# Patient Record
Sex: Male | Born: 1980 | Race: White | Hispanic: No | Marital: Single | Smoking: Former smoker
Health system: Southern US, Community
[De-identification: ages and names within clinical notes are randomized; demographics above are authoritative.]

## PROBLEM LIST (undated history)

## (undated) DIAGNOSIS — R7303 Prediabetes: Secondary | ICD-10-CM

## (undated) DIAGNOSIS — R569 Unspecified convulsions: Secondary | ICD-10-CM

## (undated) DIAGNOSIS — K5792 Diverticulitis of intestine, part unspecified, without perforation or abscess without bleeding: Secondary | ICD-10-CM

## (undated) DIAGNOSIS — F32A Depression, unspecified: Secondary | ICD-10-CM

## (undated) DIAGNOSIS — E119 Type 2 diabetes mellitus without complications: Secondary | ICD-10-CM

## (undated) DIAGNOSIS — M1990 Unspecified osteoarthritis, unspecified site: Secondary | ICD-10-CM

## (undated) DIAGNOSIS — K219 Gastro-esophageal reflux disease without esophagitis: Secondary | ICD-10-CM

## (undated) DIAGNOSIS — N12 Tubulo-interstitial nephritis, not specified as acute or chronic: Secondary | ICD-10-CM

## (undated) HISTORY — DX: Unspecified convulsions: R56.9

## (undated) HISTORY — DX: Gastro-esophageal reflux disease without esophagitis: K21.9

## (undated) HISTORY — DX: Type 2 diabetes mellitus without complications: E11.9

## (undated) HISTORY — DX: Unspecified osteoarthritis, unspecified site: M19.90

---

## 2011-11-29 ENCOUNTER — Emergency Department: Payer: Self-pay | Admitting: *Deleted

## 2011-11-29 LAB — URINALYSIS, COMPLETE
Bacteria: NONE SEEN
Bilirubin,UR: NEGATIVE
Blood: NEGATIVE
Glucose,UR: NEGATIVE mg/dL (ref 0–75)
Leukocyte Esterase: NEGATIVE
Nitrite: NEGATIVE
Ph: 6 (ref 4.5–8.0)
Protein: NEGATIVE
RBC,UR: 1 /HPF (ref 0–5)
Squamous Epithelial: <1
WBC UR: 2 /HPF (ref 0–5)

## 2011-11-29 LAB — CBC
HCT: 44 % (ref 40.0–52.0)
MCHC: 33 g/dL (ref 32.0–36.0)
MCV: 85 fL (ref 80–100)
Platelet: 262 10*3/uL (ref 150–440)
RBC: 5.2 10*6/uL (ref 4.40–5.90)
RDW: 14 % (ref 11.5–14.5)
WBC: 11.1 10*3/uL — ABNORMAL HIGH (ref 3.8–10.6)

## 2011-11-29 LAB — COMPREHENSIVE METABOLIC PANEL
Albumin: 3.5 g/dL (ref 3.4–5.0)
Alkaline Phosphatase: 127 U/L (ref 50–136)
Calcium, Total: 9.5 mg/dL (ref 8.5–10.1)
Co2: 28 mmol/L (ref 21–32)
Creatinine: 0.79 mg/dL (ref 0.60–1.30)
Glucose: 104 mg/dL — ABNORMAL HIGH (ref 65–99)
Osmolality: 284 (ref 275–301)
SGOT(AST): 183 U/L — ABNORMAL HIGH (ref 15–37)
SGPT (ALT): 119 U/L — ABNORMAL HIGH
Sodium: 142 mmol/L (ref 136–145)
Total Protein: 7.1 g/dL (ref 6.4–8.2)

## 2013-02-16 ENCOUNTER — Emergency Department: Payer: Self-pay | Admitting: Emergency Medicine

## 2013-09-05 ENCOUNTER — Emergency Department: Payer: Self-pay | Admitting: Emergency Medicine

## 2018-01-11 ENCOUNTER — Emergency Department: Payer: BLUE CROSS/BLUE SHIELD

## 2018-01-11 ENCOUNTER — Inpatient Hospital Stay
Admission: EM | Admit: 2018-01-11 | Discharge: 2018-01-12 | DRG: 392 | Disposition: A | Discharge status: Home / Self Care | Payer: BLUE CROSS/BLUE SHIELD | Attending: Internal Medicine | Admitting: Internal Medicine

## 2018-01-11 ENCOUNTER — Other Ambulatory Visit: Payer: Self-pay

## 2018-01-11 DIAGNOSIS — F17200 Nicotine dependence, unspecified, uncomplicated: Secondary | ICD-10-CM | POA: Diagnosis present

## 2018-01-11 DIAGNOSIS — K5792 Diverticulitis of intestine, part unspecified, without perforation or abscess without bleeding: Secondary | ICD-10-CM | POA: Diagnosis present

## 2018-01-11 DIAGNOSIS — K5732 Diverticulitis of large intestine without perforation or abscess without bleeding: Secondary | ICD-10-CM

## 2018-01-11 DIAGNOSIS — R109 Unspecified abdominal pain: Secondary | ICD-10-CM

## 2018-01-11 DIAGNOSIS — R300 Dysuria: Secondary | ICD-10-CM | POA: Diagnosis present

## 2018-01-11 DIAGNOSIS — Z6841 Body Mass Index (BMI) 40.0 and over, adult: Secondary | ICD-10-CM | POA: Diagnosis not present

## 2018-01-11 DIAGNOSIS — R7303 Prediabetes: Secondary | ICD-10-CM | POA: Diagnosis present

## 2018-01-11 HISTORY — DX: Diverticulitis of intestine, part unspecified, without perforation or abscess without bleeding: K57.92

## 2018-01-11 HISTORY — DX: Prediabetes: R73.03

## 2018-01-11 LAB — URINALYSIS, COMPLETE (UACMP) WITH MICROSCOPIC
BILIRUBIN URINE: NEGATIVE
Glucose, UA: NEGATIVE mg/dL
KETONES UR: NEGATIVE mg/dL
LEUKOCYTES UA: NEGATIVE
Nitrite: NEGATIVE
PROTEIN: NEGATIVE mg/dL
Specific Gravity, Urine: 1.013 (ref 1.005–1.030)
Squamous Epithelial / HPF: NONE SEEN (ref 0–5)
pH: 7 (ref 5.0–8.0)

## 2018-01-11 LAB — CBC WITH DIFFERENTIAL/PLATELET
BASOS ABS: 0.2 10*3/uL — AB (ref 0–0.1)
BASOS PCT: 1 %
EOS ABS: 0.2 10*3/uL (ref 0–0.7)
Eosinophils Relative: 2 %
HCT: 42.9 % (ref 40.0–52.0)
Hemoglobin: 14.4 g/dL (ref 13.0–18.0)
Lymphocytes Relative: 12 %
Lymphs Abs: 1.9 10*3/uL (ref 1.0–3.6)
MCH: 28.3 pg (ref 26.0–34.0)
MCHC: 33.5 g/dL (ref 32.0–36.0)
MCV: 84.3 fL (ref 80.0–100.0)
Monocytes Absolute: 1 10*3/uL (ref 0.2–1.0)
Monocytes Relative: 6 %
Neutro Abs: 12.3 10*3/uL — ABNORMAL HIGH (ref 1.4–6.5)
Neutrophils Relative %: 79 %
PLATELETS: 365 10*3/uL (ref 150–440)
RBC: 5.09 MIL/uL (ref 4.40–5.90)
RDW: 15 % — ABNORMAL HIGH (ref 11.5–14.5)
WBC: 15.5 10*3/uL — AB (ref 3.8–10.6)

## 2018-01-11 LAB — COMPREHENSIVE METABOLIC PANEL
ALT: 24 U/L (ref 0–44)
ANION GAP: 9 (ref 5–15)
AST: 27 U/L (ref 15–41)
Albumin: 3.6 g/dL (ref 3.5–5.0)
Alkaline Phosphatase: 79 U/L (ref 38–126)
BILIRUBIN TOTAL: 1.2 mg/dL (ref 0.3–1.2)
BUN: 9 mg/dL (ref 6–20)
CO2: 28 mmol/L (ref 22–32)
Calcium: 8.9 mg/dL (ref 8.9–10.3)
Chloride: 100 mmol/L (ref 98–111)
Creatinine, Ser: 0.69 mg/dL (ref 0.61–1.24)
GFR calc Af Amer: >60 mL/min (ref >60)
GFR calc non Af Amer: >60 mL/min (ref >60)
Glucose, Bld: 104 mg/dL — ABNORMAL HIGH (ref 70–99)
POTASSIUM: 4.3 mmol/L (ref 3.5–5.1)
Sodium: 137 mmol/L (ref 135–145)
TOTAL PROTEIN: 7.5 g/dL (ref 6.5–8.1)

## 2018-01-11 LAB — LIPASE, BLOOD: Lipase: 23 U/L (ref 11–51)

## 2018-01-11 MED ORDER — ONDANSETRON HCL 4 MG/2ML IJ SOLN: 4.0000 mg | Freq: Four times a day (QID) | INTRAMUSCULAR | Status: DC | PRN

## 2018-01-11 MED ORDER — METRONIDAZOLE 500 MG PO TABS
500.0000 mg | ORAL_TABLET | Freq: Three times a day (TID) | ORAL | Status: DC
  Administered 2018-01-11 – 2018-01-12 (×2): 500 mg via ORAL
  Filled 2018-01-11 (×5): qty 1

## 2018-01-11 MED ORDER — ONDANSETRON HCL 4 MG PO TABS: 4.0000 mg | ORAL_TABLET | Freq: Four times a day (QID) | ORAL | Status: DC | PRN

## 2018-01-11 MED ORDER — ENOXAPARIN SODIUM 40 MG/0.4ML ~~LOC~~ SOLN
40.0000 mg | Freq: Two times a day (BID) | SUBCUTANEOUS | Status: DC
  Administered 2018-01-12: 40 mg via SUBCUTANEOUS

## 2018-01-11 MED ORDER — ACETAMINOPHEN 650 MG RE SUPP: 650.0000 mg | Freq: Four times a day (QID) | RECTAL | Status: DC | PRN

## 2018-01-11 MED ORDER — MORPHINE SULFATE (PF) 2 MG/ML IV SOLN: 2.0000 mg | INTRAVENOUS | Status: DC | PRN

## 2018-01-11 MED ORDER — METRONIDAZOLE IN NACL 5-0.79 MG/ML-% IV SOLN
500.0000 mg | Freq: Once | INTRAVENOUS | Status: AC
  Administered 2018-01-11: 500 mg via INTRAVENOUS
  Filled 2018-01-11: qty 100

## 2018-01-11 MED ORDER — CIPROFLOXACIN IN D5W 400 MG/200ML IV SOLN
400.0000 mg | Freq: Once | INTRAVENOUS | Status: AC
  Administered 2018-01-11: 400 mg via INTRAVENOUS
  Filled 2018-01-11: qty 200

## 2018-01-11 MED ORDER — KETOROLAC TROMETHAMINE 30 MG/ML IJ SOLN
30.0000 mg | Freq: Once | INTRAMUSCULAR | Status: AC
  Administered 2018-01-11: 30 mg via INTRAVENOUS
  Filled 2018-01-11: qty 1

## 2018-01-11 MED ORDER — ENOXAPARIN SODIUM 40 MG/0.4ML ~~LOC~~ SOLN
40.0000 mg | Freq: Two times a day (BID) | SUBCUTANEOUS | Status: DC
  Administered 2018-01-11: 40 mg via SUBCUTANEOUS
  Filled 2018-01-11 (×2): qty 0.4

## 2018-01-11 MED ORDER — POLYETHYLENE GLYCOL 3350 17 G PO PACK: 17.0000 g | PACK | Freq: Every day | ORAL | Status: DC | PRN

## 2018-01-11 MED ORDER — SODIUM CHLORIDE 0.9 % IV SOLN
INTRAVENOUS | Status: DC
  Administered 2018-01-11 – 2018-01-12 (×2): via INTRAVENOUS

## 2018-01-11 MED ORDER — METRONIDAZOLE 500 MG PO TABS
500.0000 mg | ORAL_TABLET | Freq: Three times a day (TID) | ORAL | Status: DC
  Filled 2018-01-11 (×2): qty 1

## 2018-01-11 MED ORDER — CIPROFLOXACIN IN D5W 400 MG/200ML IV SOLN
400.0000 mg | Freq: Two times a day (BID) | INTRAVENOUS | Status: DC
  Administered 2018-01-11: 400 mg via INTRAVENOUS
  Filled 2018-01-11 (×4): qty 200

## 2018-01-11 MED ORDER — ACETAMINOPHEN 325 MG PO TABS: 650.0000 mg | ORAL_TABLET | Freq: Four times a day (QID) | ORAL | Status: DC | PRN

## 2018-01-11 MED ORDER — HYDROCODONE-ACETAMINOPHEN 5-325 MG PO TABS
1.0000 | ORAL_TABLET | ORAL | Status: DC | PRN
  Administered 2018-01-11 (×2): 2 via ORAL
  Filled 2018-01-11 (×2): qty 2

## 2018-01-11 NOTE — Progress Notes (Signed)
Anticoagulation monitoring(Lovenox):  37yo  male ordered Lovenox 40 mg Q24h  Filed Weights   01/11/18 0814  Weight: (!) 362 lb (164.2 kg)   BMI 56.8   Lab Results  Component Value Date   CREATININE 0.69 01/11/2018   CREATININE 0.79 11/29/2011   Estimated Creatinine Clearance: 184.6 mL/min (by C-G formula based on SCr of 0.69 mg/dL). Hemoglobin & Hematocrit     Component Value Date/Time   HGB 14.4 01/11/2018 0910   HGB 14.5 11/29/2011 0707   HCT 42.9 01/11/2018 0910   HCT 44.0 11/29/2011 0707     Per Protocol for Patient with estCrcl > 30 ml/min and BMI > 40, will transition to Lovenox 40 mg BID.

## 2018-01-11 NOTE — ED Provider Notes (Signed)
[facility] Emergency Department Provider Note   ____________________________________________   First MD Initiated Contact with Patient 01/11/18 0827     (approximate)  I have reviewed the triage vital signs and the nursing notes.   HISTORY  Chief Complaint Abdominal Pain    HPI Tameem Pullara is a 37 y.o. male who complains of onset of left sided pain yesterday afternoon.  He also has some dysuria.  Pain is worse with movement or twisting.  He has not seen any blood in the urine.  He denies any nausea vomiting diarrhea or fever.  Pain is moderate  and sharp.   Past Medical History:  Diagnosis Date  . Prediabetes     Patient Active Problem List   Diagnosis Date Noted  . Diverticulitis 01/11/2018    History reviewed. No pertinent surgical history.  Prior to Admission medications   Medication Sig Start Date End Date Taking? Authorizing Provider  ciprofloxacin (CIPRO) 500 MG tablet Take 1 tablet (500 mg total) by mouth 2 (two) times daily for 10 days. 01/12/18 01/22/18  Gladstone Lighter, MD  metroNIDAZOLE (FLAGYL) 500 MG tablet Take 1 tablet (500 mg total) by mouth every 8 (eight) hours for 10 days. 01/12/18 01/22/18  Gladstone Lighter, MD    Allergies Patient has no known allergies.  History reviewed. No pertinent family history.  Social History Social History   Tobacco Use  . Smoking status: Current Every Day Smoker  . Smokeless tobacco: Never Used  Substance Use Topics  . Alcohol use: Yes  . Drug use: Not Currently    Review of Systems  Constitutional: No fever/chills Eyes: No visual changes. ENT: No sore throat. Cardiovascular: Denies chest pain. Respiratory: Denies shortness of breath. Gastrointestinal: See HPI. Genitourinary: dysuria. Musculoskeletal: Negative for back pain. Skin: Negative for rash. Neurological: Negative for headaches, focal weakness ____________________________________________   PHYSICAL  EXAM:  VITAL SIGNS: ED Triage Vitals  Enc Vitals Group     BP 01/11/18 0821 132/72     Pulse Rate 01/11/18 0821 100     Resp 01/11/18 0821 14     Temp 01/11/18 0821 98.3 F (36.8 C)     Temp Source 01/11/18 0821 Oral     SpO2 01/11/18 0821 94 %     Weight 01/11/18 0814 (!) 362 lb (164.2 kg)     Height 01/11/18 0814 5\' 7"  (1.702 m)     Head Circumference --      Peak Flow --      Pain Score 01/11/18 0814 8     Pain Loc --      Pain Edu? --      Excl. in [place]? --     Constitutional: Alert and oriented. Well appearing and in no acute distress. Eyes: Conjunctivae are normal.  Head: Atraumatic. Nose: No congestion/rhinnorhea. Mouth/Throat: Mucous membranes are moist.  Oropharynx non-erythematous. Neck: No stridor. Cardiovascular: Normal rate, regular rhythm. Grossly normal heart sounds.  Good peripheral circulation. Respiratory: Normal respiratory effort.  No retractions. Lungs CTAB. Gastrointestinal: Soft tender to palpation slightly to percussion of the left lower quadrant. No distention. No abdominal bruits. No CVA tenderness. Musculoskeletal: No lower extremity tenderness nor edema. . Neurologic:  Normal speech and language. No gross focal neurologic deficits are appreciated. No gait instability. Skin:  Skin is warm, dry and intact. No rash noted. Psychiatric: Mood and affect are normal. Speech and behavior are normal.  ____________________________________________   LABS (all labs ordered are listed, but only abnormal results are displayed)  Labs Reviewed  COMPREHENSIVE METABOLIC PANEL - Abnormal; Notable for the following components:      Result Value   Glucose, Bld 104 (*)    All other components within normal limits  URINALYSIS, COMPLETE (UACMP) WITH MICROSCOPIC - Abnormal; Notable for the following components:   Color, Urine YELLOW (*)    APPearance CLEAR (*)    Hgb urine dipstick LARGE (*)    Bacteria, UA RARE (*)    All other components within normal limits  CBC  WITH DIFFERENTIAL/PLATELET - Abnormal; Notable for the following components:   WBC 15.5 (*)    RDW 15.0 (*)    Neutro Abs 12.3 (*)    Basophils Absolute 0.2 (*)    All other components within normal limits  CBC - Abnormal; Notable for the following components:   WBC 13.1 (*)    RDW 15.3 (*)    All other components within normal limits  BASIC METABOLIC PANEL - Abnormal; Notable for the following components:   Glucose, Bld 106 (*)    Calcium 8.6 (*)    All other components within normal limits  LIPASE, BLOOD  HIV ANTIBODY (ROUTINE TESTING)   ____________________________________________  EKG   ____________________________________________  RADIOLOGY  ED MD interpretation: CT read as diverticulitis.  There is no stone.  Official radiology report(s): No results found.  ____________________________________________   PROCEDURES  Procedure(s) performed:   Procedures  Critical Care performed:  ____________________________________________   INITIAL IMPRESSION / ASSESSMENT AND PLAN / ED COURSE   Patient with elevated white count tenderness to palpation percussion which goes along with peritonitis.  Will admit him.        ____________________________________________   FINAL CLINICAL IMPRESSION(S) / ED DIAGNOSES  Final diagnoses:  Diverticulitis of colon  Abdominal pain, unspecified abdominal location     ED Discharge Orders         Ordered    metroNIDAZOLE (FLAGYL) 500 MG tablet  Every 8 hours     01/12/18 0922    ciprofloxacin (CIPRO) 500 MG tablet  2 times daily     01/12/18 5852    Activity as tolerated - No restrictions     01/12/18 7782    Diet general     01/12/18 4235           Note:  This document was prepared using Dragon voice recognition software and may include unintentional dictation errors.    Nena Polio, MD 01/19/18 [phone]

## 2018-01-11 NOTE — H&P (Signed)
[place] [facility] [place] NAME: Marcus Little    MR#:  [id number]  DATE OF BIRTH:  Jun 13, 1980  DATE OF ADMISSION:  01/11/2018  PRIMARY CARE PHYSICIAN: Patient, No Pcp Per   REQUESTING/REFERRING PHYSICIAN:   CHIEF COMPLAINT:   Chief Complaint  Patient presents with  . Abdominal Pain    HISTORY OF PRESENT ILLNESS: Marcus Little  is a 37 y.o. male with a known history per below which also includes morbid obesity presenting with 1 day history of worsening abdominal pain associated with nausea, pain with urination, made worse with any movement/activity, I in the emergency room patient was found to have acute sigmoid diverticulitis, white count 15,000, patient evaluated the bedside, significant other present, patient is now been admitted for acute sigmoid diverticulitis.  PAST MEDICAL HISTORY:   Past Medical History:  Diagnosis Date  . Prediabetes     PAST SURGICAL HISTORY:  None  SOCIAL HISTORY:  Social History   Tobacco Use  . Smoking status: Current Every Day Smoker  . Smokeless tobacco: Never Used  Substance Use Topics  . Alcohol use: Yes    FAMILY HISTORY:  HTN  DRUG ALLERGIES: No Known Allergies  REVIEW OF SYSTEMS:   CONSTITUTIONAL: No fever, fatigue or weakness.  EYES: No blurred or double vision.  EARS, NOSE, AND THROAT: No tinnitus or ear pain.  RESPIRATORY: No cough, shortness of breath, wheezing or hemoptysis.  CARDIOVASCULAR: No chest pain, orthopnea, edema.  GASTROINTESTINAL: + nausea, abdominal pain.  GENITOURINARY: + dysuria, no hematuria.  ENDOCRINE: No polyuria, nocturia,  HEMATOLOGY: No anemia, easy bruising or bleeding SKIN: No rash or lesion. MUSCULOSKELETAL: No joint pain or arthritis.   NEUROLOGIC: No tingling, numbness, weakness.  PSYCHIATRY: No anxiety or depression.   MEDICATIONS AT HOME:  Prior to Admission medications   Not on File      PHYSICAL EXAMINATION:   VITAL SIGNS: Blood  pressure 135/84, pulse 89, temperature 98.3 F (36.8 C), temperature source Oral, resp. rate 16, height 5\' 7"  (1.702 m), weight (!) 164.2 kg, SpO2 96 %.  GENERAL:  37 y.o.-year-old patient lying in the bed with no acute distress.  Extreme morbid obesity EYES: Pupils equal, round, reactive to light and accommodation. No scleral icterus. Extraocular muscles intact.  HEENT: Head atraumatic, normocephalic. Oropharynx and nasopharynx clear.  NECK:  Supple, no jugular venous distention. No thyroid enlargement, no tenderness.  LUNGS: Normal breath sounds bilaterally, no wheezing, rales,rhonchi or crepitation. No use of accessory muscles of respiration.  CARDIOVASCULAR: S1, S2 normal. No murmurs, rubs, or gallops.  ABDOMEN: Soft, +tender, no rebound/guarding, nondistended. Bowel sounds present. No organomegaly or mass.  EXTREMITIES: No pedal edema, cyanosis, or clubbing.  NEUROLOGIC: Cranial nerves II through XII are intact. Muscle strength 5/5 in all extremities. Sensation intact. Gait not checked.  PSYCHIATRIC: The patient is alert and oriented x 3.  SKIN: No obvious rash, lesion, or ulcer.   LABORATORY PANEL:   CBC Recent Labs  Lab 01/11/18 0910  WBC 15.5*  HGB 14.4  HCT 42.9  PLT 365  MCV 84.3  MCH 28.3  MCHC 33.5  RDW 15.0*  LYMPHSABS 1.9  MONOABS 1.0  EOSABS 0.2  BASOSABS 0.2*   ------------------------------------------------------------------------------------------------------------------  Chemistries  Recent Labs  Lab 01/11/18 0910  NA 137  K 4.3  CL 100  CO2 28  GLUCOSE 104*  BUN 9  CREATININE 0.69  CALCIUM 8.9  AST 27  ALT 24  ALKPHOS 79  BILITOT 1.2   ------------------------------------------------------------------------------------------------------------------  estimated creatinine clearance is 184.6 mL/min (by C-G formula based on SCr of 0.69  mg/dL). ------------------------------------------------------------------------------------------------------------------ No results for input(s): TSH, T4TOTAL, T3FREE, THYROIDAB in the last 72 hours.  Invalid input(s): FREET3   Coagulation profile No results for input(s): INR, PROTIME in the last 168 hours. ------------------------------------------------------------------------------------------------------------------- No results for input(s): DDIMER in the last 72 hours. -------------------------------------------------------------------------------------------------------------------  Cardiac Enzymes No results for input(s): CKMB, TROPONINI, MYOGLOBIN in the last 168 hours.  Invalid input(s): CK ------------------------------------------------------------------------------------------------------------------ Invalid input(s): POCBNP  ---------------------------------------------------------------------------------------------------------------  Urinalysis    Component Value Date/Time   COLORURINE YELLOW (A) 01/11/2018 0849   APPEARANCEUR CLEAR (A) 01/11/2018 0849   APPEARANCEUR Clear 11/29/2011 0859   LABSPEC 1.013 01/11/2018 0849   LABSPEC 1.020 11/29/2011 0859   PHURINE 7.0 01/11/2018 0849   GLUCOSEU NEGATIVE 01/11/2018 0849   GLUCOSEU Negative 11/29/2011 0859   HGBUR LARGE (A) 01/11/2018 0849   BILIRUBINUR NEGATIVE 01/11/2018 0849   BILIRUBINUR Negative 11/29/2011 0859   KETONESUR NEGATIVE 01/11/2018 0849   PROTEINUR NEGATIVE 01/11/2018 0849   NITRITE NEGATIVE 01/11/2018 0849   LEUKOCYTESUR NEGATIVE 01/11/2018 0849   LEUKOCYTESUR Negative 11/29/2011 0859     RADIOLOGY: Ct Renal Stone Study  Result Date: 01/11/2018 CLINICAL DATA:  Flank and lower abdominal pain with urinary frequency EXAM: CT ABDOMEN AND PELVIS WITHOUT CONTRAST TECHNIQUE: Multidetector CT imaging of the abdomen and pelvis was performed following the standard protocol without IV contrast.  COMPARISON:  None. FINDINGS: Lower chest: No acute abnormality. Hepatobiliary: No focal liver abnormality is seen. No gallstones, gallbladder wall thickening, or biliary dilatation. Pancreas: Unremarkable. No pancreatic ductal dilatation or surrounding inflammatory changes. Spleen: Spleen is normal in size. Splenic calcified granuloma noted superiorly. No other focal splenic abnormality. Adrenals/Urinary Tract: Adrenal glands are unremarkable. Kidneys are normal, without renal calculi, focal lesion, or hydronephrosis. Bladder is unremarkable. Stomach/Bowel: Negative for bowel obstruction, significant dilatation, ileus, or free air. Normal appendix. In the left abdomen, the mid descending colon demonstrates diverticular disease with surrounding pericolonic inflammation/edema compatible with acute diverticulitis. No evidence fluid collection, abscess, hematoma, obstruction or perforation. Vascular/Lymphatic: No significant vascular findings are present. No enlarged abdominal or pelvic lymph nodes. Reproductive: Prostate normal in size.  Seminal vesicles symmetric. Other: Fat containing right inguinal hernia noted. No ascites. Intact abdominal wall. Musculoskeletal: No acute osseous finding. IMPRESSION: Acute mid left descending colon diverticulitis. No other complicating feature. Negative for obstructing urinary tract or ureteral calculus. No hydronephrosis or obstructive uropathy. Fat containing right inguinal hernia Electronically Signed   By: Jerilynn Mages.  Shick M.D.   On: 01/11/2018 09:23    EKG: No orders found for this or any previous visit.  IMPRESSION AND PLAN: *Acute sigmoid diverticulitis Admit to regular nursing for bed, clear liquid diet for now, IV fluids for rehydration, gastroenterology to see, empiric Cipro/Flagyl for 5-7-day course, adult pain protocol, and continue close medical monitoring  *Acute leukocytosis Most likely secondary to above Plan of care as stated above  *Chronic extreme morbid  obesity Most likely secondary to excess calories Lifestyle modification recommended  *Acute dysuria Most likely secondary to above Urinalysis unimpressive Continue conservative management  *Prediabetes Check hemoglobin A1c, continue conservative management   All the records are reviewed and case discussed with ED provider. Management plans discussed with the patient, family and they are in agreement.  CODE STATUS:full    TOTAL TIME TAKING CARE OF THIS PATIENT: 45 minutes.    Avel Peace Salary M.D on 01/11/2018   Between 7am to 6pm - Pager - [phone]  After 6pm go  to [email or web address] - password [email or web address] [facility]  Office  [phone]  CC: Primary care physician; Patient, No Pcp Per   Note: This dictation was prepared with Dragon dictation along with smaller phrase technology. Any transcriptional errors that result from this process are unintentional.

## 2018-01-11 NOTE — Progress Notes (Signed)
Family Meeting Note  Advance Directive:yes  Today a meeting took place with the Patient.  Patient is able to participate   The following clinical team members were present during this meeting:MD  The following were discussed:Patient's diagnosis: Diverticulitis, Patient's progosis: Unable to determine and Goals for treatment: Full Code  Additional follow-up to be provided: prn  Time spent during discussion:20 minutes  Gorden Harms, MD

## 2018-01-11 NOTE — ED Triage Notes (Signed)
Pt c/o lower abd pain with increased urinary frequency. Denies N/V/D.Marcus Little

## 2018-01-11 NOTE — Consult Note (Signed)
Marcus Little , MD [address] [place], [place], [place], [address] [id number], [place], [place], [place], [address] Phone: [phone]  Fax: [phone]  Consultation  Referring Provider:     Dr Jerelyn Charles  Primary Care Physician:  Patient, No Pcp Per Primary Gastroenterologist:  None          Reason for Consultation:     Diverticulitis  Date of Admission:  01/11/2018 Date of Consultation:  01/11/2018         HPI:   Marcus Little is a 37 y.o. male was admitted on 01/11/2018 around noon with a 1 day history of abdominal pain nausea.Says the lower abdominal pain , non radiating began yesterday , 20 mins after having some sea food, no fevers, no vomiting , no similar episode before, no family history of colon cancer or polyps. Denies any prior colonoscopy   He underwent a CT renal stone study and was noted to have acute mid left descending colon diverticulitis with no other complicating features.  Normal lipase, CMP was normal except for mildly elevated glucose 104, elevated white cell count of 15.5, being treated with ciprofloxacin and Flagyl. Presently feels better  Past Medical History:  Diagnosis Date  . Prediabetes     History reviewed. No pertinent surgical history.  Prior to Admission medications   Not on File    History reviewed. No pertinent family history.   Social History   Tobacco Use  . Smoking status: Current Every Day Smoker  . Smokeless tobacco: Never Used  Substance Use Topics  . Alcohol use: Yes  . Drug use: Not Currently    Allergies as of 01/11/2018  . (No Known Allergies)    Review of Systems:    All systems reviewed and negative except where noted in HPI.   Physical Exam:  Vital signs in last 24 hours: Temp:  [97.7 F (36.5 C)-98.3 F (36.8 C)] 97.7 F (36.5 C) (09/01 1343) Pulse Rate:  [80-100] 80 (09/01 1343) Resp:  [14-20] 20 (09/01 1343) BP: (111-135)/(60-84) 111/60 (09/01 1343) SpO2:  [94 %-98 %] 98 % (09/01 1343) Weight:   [164.2 kg] 164.2 kg (09/01 0814)   General:   Pleasant, cooperative in NAD Head:  Normocephalic and atraumatic. Eyes:   No icterus.   Conjunctiva pink. PERRLA. Ears:  Normal auditory acuity. Neck:  Supple; no masses or thyroidomegaly Lungs: Respirations even and unlabored. Lungs clear to auscultation bilaterally.   No wheezes, crackles, or rhonchi.  Heart:  Regular rate and rhythm;  Without murmur, clicks, rubs or gallops Abdomen:  Soft, nondistended, nontender. Normal bowel sounds. No appreciable masses or hepatomegaly.  No rebound or guarding.  Neurologic:  Alert and oriented x3;  grossly normal neurologically. Skin:  Intact without significant lesions or rashes. Cervical Nodes:  No significant cervical adenopathy. Psych:  Alert and cooperative. Normal affect.  LAB RESULTS: Recent Labs    01/11/18 0910  WBC 15.5*  HGB 14.4  HCT 42.9  PLT 365   BMET Recent Labs    01/11/18 0910  NA 137  K 4.3  CL 100  CO2 28  GLUCOSE 104*  BUN 9  CREATININE 0.69  CALCIUM 8.9   LFT Recent Labs    01/11/18 0910  PROT 7.5  ALBUMIN 3.6  AST 27  ALT 24  ALKPHOS 79  BILITOT 1.2   PT/INR No results for input(s): LABPROT, INR in the last 72 hours.  STUDIES: Ct Renal Stone Study  Result Date: 01/11/2018 CLINICAL DATA:  Flank and lower abdominal pain with urinary frequency EXAM: CT ABDOMEN AND PELVIS WITHOUT CONTRAST TECHNIQUE: Multidetector CT imaging of the abdomen and pelvis was performed following the standard protocol without IV contrast. COMPARISON:  None. FINDINGS: Lower chest: No acute abnormality. Hepatobiliary: No focal liver abnormality is seen. No gallstones, gallbladder wall thickening, or biliary dilatation. Pancreas: Unremarkable. No pancreatic ductal dilatation or surrounding inflammatory changes. Spleen: Spleen is normal in size. Splenic calcified granuloma noted superiorly. No other focal splenic abnormality. Adrenals/Urinary Tract: Adrenal glands are unremarkable.  Kidneys are normal, without renal calculi, focal lesion, or hydronephrosis. Bladder is unremarkable. Stomach/Bowel: Negative for bowel obstruction, significant dilatation, ileus, or free air. Normal appendix. In the left abdomen, the mid descending colon demonstrates diverticular disease with surrounding pericolonic inflammation/edema compatible with acute diverticulitis. No evidence fluid collection, abscess, hematoma, obstruction or perforation. Vascular/Lymphatic: No significant vascular findings are present. No enlarged abdominal or pelvic lymph nodes. Reproductive: Prostate normal in size.  Seminal vesicles symmetric. Other: Fat containing right inguinal hernia noted. No ascites. Intact abdominal wall. Musculoskeletal: No acute osseous finding. IMPRESSION: Acute mid left descending colon diverticulitis. No other complicating feature. Negative for obstructing urinary tract or ureteral calculus. No hydronephrosis or obstructive uropathy. Fat containing right inguinal hernia Electronically Signed   By: Jerilynn Mages.  Shick M.D.   On: 01/11/2018 09:23      Impression / Plan:   Marcus Little is a 37 y.o. y/o male admitted with acute uncomplicated diverticulitis of the descending colon.  Commenced on antibiotics. Plan 1.  Continue IV antibiotics.  Commence on clears when abdominal pain is improving. 2.  Will require a colonoscopy in 6 to 8 weeks after the inflammation has subsided to rule out a neoplasm of the colon which can occasionally  present as diverticulitis.  Thank you for involving me in the care of this patient.      LOS: 0 days   Marcus Bellows, MD  01/11/2018, 3:17 PM

## 2018-01-12 LAB — BASIC METABOLIC PANEL
Anion gap: 6 (ref 5–15)
BUN: 7 mg/dL (ref 6–20)
CO2: 29 mmol/L (ref 22–32)
CREATININE: 0.67 mg/dL (ref 0.61–1.24)
Calcium: 8.6 mg/dL — ABNORMAL LOW (ref 8.9–10.3)
Chloride: 102 mmol/L (ref 98–111)
GFR calc Af Amer: >60 mL/min (ref >60)
Glucose, Bld: 106 mg/dL — ABNORMAL HIGH (ref 70–99)
Potassium: 3.6 mmol/L (ref 3.5–5.1)
SODIUM: 137 mmol/L (ref 135–145)

## 2018-01-12 LAB — CBC
HCT: 40.1 % (ref 40.0–52.0)
Hemoglobin: 13.7 g/dL (ref 13.0–18.0)
MCH: 29 pg (ref 26.0–34.0)
MCHC: 34.1 g/dL (ref 32.0–36.0)
MCV: 85.1 fL (ref 80.0–100.0)
PLATELETS: 352 10*3/uL (ref 150–440)
RBC: 4.71 MIL/uL (ref 4.40–5.90)
RDW: 15.3 % — ABNORMAL HIGH (ref 11.5–14.5)
WBC: 13.1 10*3/uL — ABNORMAL HIGH (ref 3.8–10.6)

## 2018-01-12 MED ORDER — METRONIDAZOLE 500 MG PO TABS: 500.0000 mg | ORAL_TABLET | Freq: Three times a day (TID) | ORAL | 0 refills | Status: AC

## 2018-01-12 MED ORDER — CIPROFLOXACIN HCL 500 MG PO TABS
500.0000 mg | ORAL_TABLET | Freq: Two times a day (BID) | ORAL | Status: DC
  Administered 2018-01-12: 500 mg via ORAL
  Filled 2018-01-12: qty 1

## 2018-01-12 MED ORDER — CIPROFLOXACIN HCL 500 MG PO TABS: 500.0000 mg | ORAL_TABLET | Freq: Two times a day (BID) | ORAL | 0 refills | Status: AC

## 2018-01-12 NOTE — Progress Notes (Signed)
Marcus Little  A and O x 4. VSS. Pt tolerating diet well. No complaints of pain or nausea. IV removed intact, prescriptions given. Pt voiced understanding of discharge instructions with no further questions. Pt discharged home.     Allergies as of 01/12/2018   No Known Allergies     Medication List    TAKE these medications   ciprofloxacin 500 MG tablet Commonly known as:  CIPRO Take 1 tablet (500 mg total) by mouth 2 (two) times daily for 10 days.   metroNIDAZOLE 500 MG tablet Commonly known as:  FLAGYL Take 1 tablet (500 mg total) by mouth every 8 (eight) hours for 10 days.       Vitals:   01/11/18 2041 01/12/18 0510  BP: 110/65 118/62  Pulse: 83 82  Resp: 20 20  Temp: 98.1 F (36.7 C) 98.2 F (36.8 C)  SpO2: 98% 99%    Francesco Sor

## 2018-01-12 NOTE — Care Management (Signed)
Discharge to home today per Dr. Tressia Miners. Will follow-up at [facility] for physician care. Family will transport Shelbie Ammons RN MSN CCM Care Management [phone]

## 2018-01-12 NOTE — Discharge Summary (Addendum)
[facility] [place] NAME: Marcus Little    MR#:  [id number]  DATE OF BIRTH:  March 27, 1981  DATE OF ADMISSION:  01/11/2018   ADMITTING PHYSICIAN: Gorden Harms, MD  DATE OF DISCHARGE: 01/12/2018 11:36 AM  PRIMARY CARE PHYSICIAN: Patient, No Pcp Per   ADMISSION DIAGNOSIS:   lower abd pain urinating alot  DISCHARGE DIAGNOSIS:   Active Problems:   Diverticulitis   SECONDARY DIAGNOSIS:   Past Medical History:  Diagnosis Date  . Prediabetes     HOSPITAL COURSE:   37 year old male with past medical history significant for obesity and prediabetes not on any home medications presents to hospital secondary to abdominal pain and nausea vomiting.  1.  Acute uncomplicated diverticulitis of descending colon-started after eating seafood.  Never had these symptoms before. -Improved with fluids, IV Cipro and Flagyl -Diet has been advanced and patient has been tolerating the diet well. -Will be discharged on oral Cipro and Flagyl. -Appreciate GI consult.  Outpatient colonoscopy recommended in 4 to 6 weeks  2.  Prediabetes-advised weight reduction and low-carb diet  Ambulating well, will be discharged home today  DISCHARGE CONDITIONS:   Guarded  CONSULTS OBTAINED:   Treatment Team:  Jonathon Bellows, MD  DRUG ALLERGIES:   No Known Allergies DISCHARGE MEDICATIONS:   Allergies as of 01/12/2018   No Known Allergies     Medication List    TAKE these medications   ciprofloxacin 500 MG tablet Commonly known as:  CIPRO Take 1 tablet (500 mg total) by mouth 2 (two) times daily for 10 days.   metroNIDAZOLE 500 MG tablet Commonly known as:  FLAGYL Take 1 tablet (500 mg total) by mouth every 8 (eight) hours for 10 days.        DISCHARGE INSTRUCTIONS:   1.  PCP follow-up in 1 to 2 weeks 2.  GI follow-up in 2 weeks  DIET:   Regular diet  ACTIVITY:   Activity as tolerated  OXYGEN:   Home Oxygen: No.  Oxygen  Delivery: room air  DISCHARGE LOCATION:   home   If you experience worsening of your admission symptoms, develop shortness of breath, life threatening emergency, suicidal or homicidal thoughts you must seek medical attention immediately by calling 911 or calling your MD immediately  if symptoms less severe.  You Must read complete instructions/literature along with all the possible adverse reactions/side effects for all the Medicines you take and that have been prescribed to you. Take any new Medicines after you have completely understood and accpet all the possible adverse reactions/side effects.   Please note  You were cared for by a hospitalist during your hospital stay. If you have any questions about your discharge medications or the care you received while you were in the hospital after you are discharged, you can call the unit and asked to speak with the hospitalist on call if the hospitalist that took care of you is not available. Once you are discharged, your primary care physician will handle any further medical issues. Please note that NO REFILLS for any discharge medications will be authorized once you are discharged, as it is imperative that you return to your primary care physician (or establish a relationship with a primary care physician if you do not have one) for your aftercare needs so that they can reassess your need for medications and monitor your lab values.    On the day of Discharge:  VITAL SIGNS:   Blood pressure  118/62, pulse 82, temperature 98.2 F (36.8 C), temperature source Oral, resp. rate 20, height 5\' 7"  (1.702 m), weight (!) 164.2 kg, SpO2 99 %.  PHYSICAL EXAMINATION:    GENERAL:  37 y.o.-year-old obese patient lying in the bed with no acute distress.  EYES: Pupils equal, round, reactive to light and accommodation. No scleral icterus. Extraocular muscles intact.  HEENT: Head atraumatic, normocephalic. Oropharynx and nasopharynx clear.  NECK:  Supple, no  jugular venous distention. No thyroid enlargement, no tenderness.  LUNGS: Normal breath sounds bilaterally, no wheezing, rales,rhonchi or crepitation. No use of accessory muscles of respiration.  Decreased bibasilar breath sounds CARDIOVASCULAR: S1, S2 normal. No murmurs, rubs, or gallops.  ABDOMEN: Soft, non-tender, non-distended. Bowel sounds present. No organomegaly or mass.  EXTREMITIES: No pedal edema, cyanosis, or clubbing.  NEUROLOGIC: Cranial nerves II through XII are intact. Muscle strength 5/5 in all extremities. Sensation intact. Gait not checked.  PSYCHIATRIC: The patient is alert and oriented x 3.  SKIN: No obvious rash, lesion, or ulcer.   DATA REVIEW:   CBC Recent Labs  Lab 01/12/18 0454  WBC 13.1*  HGB 13.7  HCT 40.1  PLT 352    Chemistries  Recent Labs  Lab 01/11/18 0910 01/12/18 0454  NA 137 137  K 4.3 3.6  CL 100 102  CO2 28 29  GLUCOSE 104* 106*  BUN 9 7  CREATININE 0.69 0.67  CALCIUM 8.9 8.6*  AST 27  --   ALT 24  --   ALKPHOS 79  --   BILITOT 1.2  --      Microbiology Results  No results found for this or any previous visit.  RADIOLOGY:  No results found.   Management plans discussed with the patient, family and they are in agreement.  CODE STATUS:     Code Status Orders  (From admission, onward)         Start     Ordered   01/11/18 1327  Full code  Continuous     01/11/18 1326        Code Status History    This patient has a current code status but no historical code status.      TOTAL TIME TAKING CARE OF THIS PATIENT: 38 minutes.    Gladstone Lighter M.D on 01/12/2018 at 1:08 PM  Between 7am to 6pm - Pager - [phone]  After 6pm go to [email or web address] - Proofreader  [facility]  Office  [phone]  CC: Primary care physician; Patient, No Pcp Per   Note: This dictation was prepared with Dragon dictation along with smaller phrase technology. Any transcriptional errors that  result from this process are unintentional.

## 2018-01-14 LAB — HIV ANTIBODY (ROUTINE TESTING W REFLEX): HIV Screen 4th Generation wRfx: NONREACTIVE

## 2018-03-24 ENCOUNTER — Other Ambulatory Visit: Payer: Self-pay

## 2018-03-24 ENCOUNTER — Ambulatory Visit
Admission: RE | Admit: 2018-03-24 | Discharge: 2018-03-24 | Disposition: A | Discharge status: Home / Self Care | Payer: BLUE CROSS/BLUE SHIELD | Source: Ambulatory Visit | Attending: Internal Medicine | Admitting: Internal Medicine

## 2018-03-24 ENCOUNTER — Encounter: Payer: Self-pay | Admitting: *Deleted

## 2018-03-24 ENCOUNTER — Ambulatory Visit: Payer: BLUE CROSS/BLUE SHIELD | Admitting: Anesthesiology

## 2018-03-24 ENCOUNTER — Encounter
Admission: RE | Disposition: A | Discharge status: Home / Self Care | Payer: Self-pay | Source: Ambulatory Visit | Attending: Internal Medicine

## 2018-03-24 DIAGNOSIS — R7303 Prediabetes: Secondary | ICD-10-CM | POA: Diagnosis not present

## 2018-03-24 DIAGNOSIS — F17200 Nicotine dependence, unspecified, uncomplicated: Secondary | ICD-10-CM | POA: Diagnosis not present

## 2018-03-24 DIAGNOSIS — K5730 Diverticulosis of large intestine without perforation or abscess without bleeding: Secondary | ICD-10-CM | POA: Diagnosis present

## 2018-03-24 DIAGNOSIS — D125 Benign neoplasm of sigmoid colon: Secondary | ICD-10-CM | POA: Diagnosis not present

## 2018-03-24 DIAGNOSIS — Z6841 Body Mass Index (BMI) 40.0 and over, adult: Secondary | ICD-10-CM | POA: Diagnosis not present

## 2018-03-24 DIAGNOSIS — Z87440 Personal history of urinary (tract) infections: Secondary | ICD-10-CM | POA: Insufficient documentation

## 2018-03-24 HISTORY — DX: Diverticulitis of intestine, part unspecified, without perforation or abscess without bleeding: K57.92

## 2018-03-24 HISTORY — DX: Tubulo-interstitial nephritis, not specified as acute or chronic: N12

## 2018-03-24 HISTORY — PX: COLONOSCOPY WITH PROPOFOL: SHX5780

## 2018-03-24 SURGERY — COLONOSCOPY WITH PROPOFOL
Anesthesia: General

## 2018-03-24 MED ORDER — PROPOFOL 500 MG/50ML IV EMUL
INTRAVENOUS | Status: AC
  Filled 2018-03-24: qty 50

## 2018-03-24 MED ORDER — SODIUM CHLORIDE 0.9 % IV SOLN
INTRAVENOUS | Status: DC
  Administered 2018-03-24: 09:00:00 via INTRAVENOUS

## 2018-03-24 MED ORDER — PROPOFOL 500 MG/50ML IV EMUL
INTRAVENOUS | Status: DC | PRN
  Administered 2018-03-24: 140 ug/kg/min via INTRAVENOUS

## 2018-03-24 NOTE — Op Note (Signed)
[facility] Patient Name: Marcus Little Procedure Date: 03/24/2018 9:53 AM MRN: [id number] Account #: [id number] Date of Birth: 01-27-1981 Admit Type: Outpatient Age: 37 Room: [facility] ENDO ROOM 1 Gender: Male Note Status: Finalized Procedure:            Colonoscopy Indications:          Follow-up of diverticulitis Providers:            Benay Pike. Alice Reichert MD, MD Referring MD:         No Local Md, MD (Referring MD) Medicines:            Propofol per Anesthesia Complications:        No immediate complications. Procedure:            Pre-Anesthesia Assessment:                       - The risks and benefits of the procedure and the                        sedation options and risks were discussed with the                        patient. All questions were answered and informed                        consent was obtained.                       - Patient identification and proposed procedure were                        verified prior to the procedure by the nurse. The                        procedure was verified in the procedure room.                       - ASA Grade Assessment: III - A patient with severe                        systemic disease.                       - After reviewing the risks and benefits, the patient                        was deemed in satisfactory condition to undergo the                        procedure.                       After obtaining informed consent, the colonoscope was                        passed under direct vision. Throughout the procedure,                        the patient's blood pressure, pulse, and oxygen  saturations were monitored continuously. The                        Colonoscope was introduced through the anus and                        advanced to the the cecum, identified by appendiceal                        orifice and ileocecal valve. The colonoscopy was      performed without difficulty. The patient tolerated the                        procedure well. The quality of the bowel preparation                        was adequate. The ileocecal valve, appendiceal orifice,                        and rectum were photographed. Findings:      The perianal and digital rectal examinations were normal. Pertinent       negatives include normal sphincter tone and no palpable rectal lesions.      Multiple small and large-mouthed diverticula were found in the sigmoid       colon.      A few small-mouthed diverticula were found in the ascending colon.      A 7 mm polyp was found in the proximal sigmoid colon. The polyp was       semi-pedunculated. The polyp was removed with a hot snare. Resection and       retrieval were complete.      The exam was otherwise without abnormality on direct and retroflexion       views. Impression:           - Diverticulosis in the sigmoid colon.                       - Diverticulosis in the ascending colon.                       - One 7 mm polyp in the proximal sigmoid colon, removed                        with a hot snare. Resected and retrieved.                       - The examination was otherwise normal on direct and                        retroflexion views. Recommendation:       - Patient has a contact number available for                        emergencies. The signs and symptoms of potential                        delayed complications were discussed with the patient.                        Return to normal activities tomorrow. Written discharge  instructions were provided to the patient.                       - Resume previous diet.                       - Continue present medications.                       - Repeat colonoscopy is recommended for surveillance.                        The colonoscopy date will be determined after pathology                        results from today's exam become  available for review.                       - Return to GI office PRN.                       - The findings and recommendations were discussed with                        the patient and their family. Procedure Code(s):    --- Professional ---                       [phone], Colonoscopy, flexible; with removal of tumor(s),                        polyp(s), or other lesion(s) by snare technique Diagnosis Code(s):    --- Professional ---                       K57.30, Diverticulosis of large intestine without                        perforation or abscess without bleeding                       K57.32, Diverticulitis of large intestine without                        perforation or abscess without bleeding                       D12.5, Benign neoplasm of sigmoid colon CPT copyright 2018 American Medical Association. All rights reserved. The codes documented in this report are preliminary and upon coder review may  be revised to meet current compliance requirements. Efrain Sella MD, MD 03/24/2018 10:29:21 AM This report has been signed electronically. Number of Addenda: 0 Note Initiated On: 03/24/2018 9:53 AM Scope Withdrawal Time: 0 hours 6 minutes 42 seconds  Total Procedure Duration: 0 hours 10 minutes 7 seconds       [facility]

## 2018-03-24 NOTE — Anesthesia Preprocedure Evaluation (Signed)
Anesthesia Evaluation  Patient identified by MRN, date of birth, ID band Patient awake    Reviewed: Allergy & Precautions, NPO status , Patient's Chart, lab work & pertinent test results  Airway Mallampati: III       Dental   Pulmonary Current Smoker,    Pulmonary exam normal        Cardiovascular negative cardio ROS Normal cardiovascular exam     Neuro/Psych negative neurological ROS  negative psych ROS   GI/Hepatic Neg liver ROS,   Endo/Other  Morbid obesity  Renal/GU negative Renal ROS  negative genitourinary   Musculoskeletal negative musculoskeletal ROS (+)   Abdominal Normal abdominal exam  (+)   Peds negative pediatric ROS (+)  Hematology   Anesthesia Other Findings   Reproductive/Obstetrics                             Anesthesia Physical Anesthesia Plan  ASA: III  Anesthesia Plan: General   Post-op Pain Management:    Induction: Intravenous  PONV Risk Score and Plan:   Airway Management Planned: Nasal Cannula  Additional Equipment:   Intra-op Plan:   Post-operative Plan:   Informed Consent: I have reviewed the patients History and Physical, chart, labs and discussed the procedure including the risks, benefits and alternatives for the proposed anesthesia with the patient or authorized representative who has indicated his/her understanding and acceptance.   Dental advisory given  Plan Discussed with: CRNA and Surgeon  Anesthesia Plan Comments:         Anesthesia Quick Evaluation

## 2018-03-24 NOTE — Anesthesia Postprocedure Evaluation (Signed)
Anesthesia Post Note  Patient: Marcus Little  Procedure(s) Performed: COLONOSCOPY WITH PROPOFOL (N/A )  Patient location during evaluation: PACU Anesthesia Type: General Level of consciousness: awake and alert and oriented Pain management: pain level controlled Vital Signs Assessment: post-procedure vital signs reviewed and stable Respiratory status: spontaneous breathing Cardiovascular status: blood pressure returned to baseline Anesthetic complications: no     Last Vitals:  Vitals:   03/24/18 0910  BP: (!) 145/86  Pulse: 81  Resp: (!) 22  Temp: (!) 35.7 C  SpO2: 97%    Last Pain:  Vitals:   03/24/18 0910  PainSc: 0-No pain                 Odel Schmid

## 2018-03-24 NOTE — H&P (Signed)
Outpatient short stay form Pre-procedure 03/24/2018 9:20 AM Marcus Little K. Alice Reichert, M.D.  Primary Physician: Not applicable  Reason for visit: Follow-up diverticulitis  History of present illness: 37 year old patient presents for follow-up of acute episode of diverticulitis, resolved with inpatient IV and outpatient oral antibiotic therapy.  No residual abdominal pain, rectal bleeding, change in bowel habits or weight loss.  Colonoscopy nave with negative family history of IBD, colon cancer or polyps.    Current Facility-Administered Medications:  .  0.9 %  sodium chloride infusion, , Intravenous, Continuous, Raymon Schlarb, Benay Pike, MD  Medications Prior to Admission  Medication Sig Dispense Refill Last Dose  . OVER THE COUNTER MEDICATION as needed (CBD oil).   Past Month at Unknown time     No Known Allergies   Past Medical History:  Diagnosis Date  . Diverticulitis 01/11/2018   Inpt   . Prediabetes   . Pyelonephritis     Review of systems:  Otherwise negative.    Physical Exam  Gen: Alert, oriented. Appears stated age.  HEENT: [place]/AT. PERRLA. Lungs: CTA, no wheezes. CV: RR nl S1, S2. Abd: soft, benign, no masses. BS+ Ext: No edema. Pulses 2+    Planned procedures: Proceed with colonoscopy. The patient understands the nature of the planned procedure, indications, risks, alternatives and potential complications including but not limited to bleeding, infection, perforation, damage to internal organs and possible oversedation/side effects from anesthesia. The patient agrees and gives consent to proceed.  Please refer to procedure notes for findings, recommendations and patient disposition/instructions.     Aurea Aronov K. Alice Reichert, M.D. Gastroenterology 03/24/2018  9:20 AM

## 2018-03-24 NOTE — Interval H&P Note (Signed)
History and Physical Interval Note:  03/24/2018 9:21 AM  Marcus Little  has presented today for surgery, with the diagnosis of HX DIVERTICULITIS  The various methods of treatment have been discussed with the patient and family. After consideration of risks, benefits and other options for treatment, the patient has consented to  Procedure(s): COLONOSCOPY WITH PROPOFOL (N/A) as a surgical intervention .  The patient's history has been reviewed, patient examined, no change in status, stable for surgery.  I have reviewed the patient's chart and labs.  Questions were answered to the patient's satisfaction.     [place], [place]

## 2018-03-24 NOTE — Anesthesia Post-op Follow-up Note (Signed)
Anesthesia QCDR form completed.        

## 2018-03-24 NOTE — Transfer of Care (Signed)
Immediate Anesthesia Transfer of Care Note  Patient: Marcus Little  Procedure(s) Performed: COLONOSCOPY WITH PROPOFOL (N/A )  Patient Location: PACU  Anesthesia Type:General  Level of Consciousness: awake, alert  and oriented  Airway & Oxygen Therapy: Patient Spontanous Breathing  Post-op Assessment: Report given to RN  Post vital signs: Reviewed and stable  Last Vitals:  Vitals Value Taken Time  BP    Temp    Pulse    Resp    SpO2      Last Pain:  Vitals:   03/24/18 0910  PainSc: 0-No pain         Complications: No apparent anesthesia complications

## 2018-03-25 ENCOUNTER — Encounter: Payer: Self-pay | Admitting: Internal Medicine

## 2018-03-25 LAB — SURGICAL PATHOLOGY

## 2019-03-02 ENCOUNTER — Emergency Department
Admission: EM | Admit: 2019-03-02 | Discharge: 2019-03-02 | Disposition: A | Discharge status: Home / Self Care | Payer: Self-pay | Attending: Emergency Medicine | Admitting: Emergency Medicine

## 2019-03-02 ENCOUNTER — Other Ambulatory Visit: Payer: Self-pay

## 2019-03-02 ENCOUNTER — Emergency Department: Payer: Self-pay

## 2019-03-02 ENCOUNTER — Encounter: Payer: Self-pay | Admitting: Emergency Medicine

## 2019-03-02 DIAGNOSIS — F1210 Cannabis abuse, uncomplicated: Secondary | ICD-10-CM | POA: Insufficient documentation

## 2019-03-02 DIAGNOSIS — R079 Chest pain, unspecified: Secondary | ICD-10-CM | POA: Insufficient documentation

## 2019-03-02 DIAGNOSIS — F17210 Nicotine dependence, cigarettes, uncomplicated: Secondary | ICD-10-CM | POA: Insufficient documentation

## 2019-03-02 LAB — CBC
HCT: 48.1 % (ref 39.0–52.0)
Hemoglobin: 15.3 g/dL (ref 13.0–17.0)
MCH: 28.2 pg (ref 26.0–34.0)
MCHC: 31.8 g/dL (ref 30.0–36.0)
MCV: 88.7 fL (ref 80.0–100.0)
Platelets: 335 10*3/uL (ref 150–400)
RBC: 5.42 MIL/uL (ref 4.22–5.81)
RDW: 15.1 % (ref 11.5–15.5)
WBC: 12 10*3/uL — ABNORMAL HIGH (ref 4.0–10.5)
nRBC: 0 % (ref 0.0–0.2)

## 2019-03-02 LAB — BASIC METABOLIC PANEL
Anion gap: 12 (ref 5–15)
BUN: 14 mg/dL (ref 6–20)
CO2: 25 mmol/L (ref 22–32)
Calcium: 9.5 mg/dL (ref 8.9–10.3)
Chloride: 102 mmol/L (ref 98–111)
Creatinine, Ser: 0.75 mg/dL (ref 0.61–1.24)
GFR calc Af Amer: >60 mL/min (ref >60)
GFR calc non Af Amer: >60 mL/min (ref >60)
Glucose, Bld: 100 mg/dL — ABNORMAL HIGH (ref 70–99)
Potassium: 3.9 mmol/L (ref 3.5–5.1)
Sodium: 139 mmol/L (ref 135–145)

## 2019-03-02 LAB — TROPONIN I (HIGH SENSITIVITY)
Troponin I (High Sensitivity): <2 ng/L (ref <18)
Troponin I (High Sensitivity): 3 ng/L (ref <18)

## 2019-03-02 NOTE — ED Triage Notes (Signed)
Patient reports central chest pressure that started this afternoon. Patient states he was standing at work when pain started. Patient reports difficulty breathing when pain was severe. Patient denies N/V or dizziness.

## 2019-03-02 NOTE — ED Notes (Signed)
Pt states that he started to experience tight chest pain tonight while at work. Pt experienced diaphoresis, denies SOB. States that his job called EMS who advised him to come to the ED for further follow up.

## 2019-03-02 NOTE — ED Provider Notes (Signed)
[facility] Emergency Department Provider Note  Time seen: 9:00 PM  I have reviewed the triage vital signs and the nursing notes.   HISTORY  Chief Complaint Chest Pain   HPI Marcus Little is a 38 y.o. male with a past medical history of diverticulitis, presents to the emergency department for chest pain.  According to the patient around 4 PM today he developed a pressure sensation in the center of his chest.  Denies any shortness of breath nausea or diaphoresis.  Patient states it lasted approximately 15 to 30 minutes slowly resolving over that time.  Has not had any chest pain since.  Denies any pleuritic pain.  Denies any leg pain or swelling.  No history of DVT.  No family history of heart disease.  No personal history of heart disease.  No recent fever cough or shortness of breath.   Past Medical History:  Diagnosis Date  . Diverticulitis 01/11/2018   Inpt   . Prediabetes   . Pyelonephritis     Patient Active Problem List   Diagnosis Date Noted  . Diverticulitis 01/11/2018    Past Surgical History:  Procedure Laterality Date  . COLONOSCOPY WITH PROPOFOL N/A 03/24/2018   Procedure: COLONOSCOPY WITH PROPOFOL;  Surgeon: [place], Benay Pike, MD;  Location: [facility] ENDOSCOPY;  Service: Gastroenterology;  Laterality: N/A;    Prior to Admission medications   Medication Sig Start Date End Date Taking? Authorizing Provider  OVER THE COUNTER MEDICATION as needed (CBD oil).    [provider]    No Known Allergies  No family history on file.  Social History Social History   Tobacco Use  . Smoking status: Current Some Day Smoker    Types: Cigars  . Smokeless tobacco: Never Used  Substance Use Topics  . Alcohol use: Yes    Comment: occasional  . Drug use: Not Currently    Types: Marijuana    Comment: Sat last use 03/21/18    Review of Systems Constitutional: Negative for fever. Cardiovascular: Mild to moderate chest pressure, now  resolved Respiratory: Negative for shortness of breath.  Negative for cough. Gastrointestinal: Negative for abdominal pain Musculoskeletal: Negative for musculoskeletal complaints Skin: Negative for skin complaints  Neurological: Negative for headache All other ROS negative  ____________________________________________   PHYSICAL EXAM:  VITAL SIGNS: ED Triage Vitals  Enc Vitals Group     BP 03/02/19 1843 (!) 151/84     Pulse Rate 03/02/19 1843 77     Resp 03/02/19 1843 16     Temp 03/02/19 1843 98.4 F (36.9 C)     Temp Source 03/02/19 1843 Oral     SpO2 03/02/19 1843 98 %     Weight 03/02/19 1838 (!) 330 lb 11 oz (150 kg)     Height 03/02/19 1838 5\' 8"  (1.727 m)     Head Circumference --      Peak Flow --      Pain Score 03/02/19 1837 3     Pain Loc --      Pain Edu? --      Excl. in [place]? --    Constitutional: Alert and oriented. Well appearing and in no distress. Eyes: Normal exam ENT      Head: Normocephalic and atraumatic.      Mouth/Throat: Mucous membranes are moist. Cardiovascular: Normal rate, regular rhythm.  Respiratory: Normal respiratory effort without tachypnea nor retractions. Breath sounds are clear Gastrointestinal: Soft and nontender. No distention.   Musculoskeletal: Nontender with normal range  of motion in all extremities.  Neurologic:  Normal speech and language. No gross focal neurologic deficits Skin:  Skin is warm, dry and intact.  Psychiatric: Mood and affect are normal.  ____________________________________________    EKG  EKG viewed and interpreted by myself shows a normal sinus rhythm at 70 bpm with a narrow QRS, normal axis, normal intervals, no concerning ST changes.  ____________________________________________    RADIOLOGY  Chest x-ray is negative.  ____________________________________________   INITIAL IMPRESSION / ASSESSMENT AND PLAN / ED COURSE  Pertinent labs & imaging results that were available during my care of the  patient were reviewed by me and considered in my medical decision making (see chart for details).   Patient presents to the emergency department for chest discomfort starting around 4 PM today.  Differential would include ACS, chest wall pain, anxiety, reflux or esophageal spasm.  Patient's lab work is thus far reassuring.  Troponin is negative.  Other lab work largely within normal limits.  EKG is reassuring and chest x-ray is negative.  As a precaution we will repeat a cardiac enzyme.  If the patient's repeat cardiac enzymes remains negative I believe the patient will be safe for discharge home with outpatient stress test follow-up.  Patient agreeable to plan of care.  Patient's repeat enzyme is negative.  Patient continues to appear well.  We will discharge the patient with PCP/cardiology follow-up for stress test.  Marcus Little was evaluated in Emergency Department on 03/02/2019 for the symptoms described in the history of present illness. He was evaluated in the context of the global COVID-19 pandemic, which necessitated consideration that the patient might be at risk for infection with the SARS-CoV-2 virus that causes COVID-19. Institutional protocols and algorithms that pertain to the evaluation of patients at risk for COVID-19 are in a state of rapid change based on information released by regulatory bodies including the CDC and federal and state organizations. These policies and algorithms were followed during the patient's care in the ED.  ____________________________________________   FINAL CLINICAL IMPRESSION(S) / ED DIAGNOSES  Chest pain   Harvest Dark, MD 03/02/19 2144

## 2019-03-02 NOTE — ED Notes (Signed)
Labs drawn and sent to lab.

## 2020-07-30 IMAGING — CT CT RENAL STONE PROTOCOL
2 of 4 series · 16 of 46 positions shown, 18 images · non-contrast
Comparison: None.

CLINICAL DATA: Flank and lower abdominal pain with urinary
frequency

EXAM:
CT ABDOMEN AND PELVIS WITHOUT CONTRAST
TECHNIQUE: Multidetector CT imaging of the abdomen and pelvis was performed
following the standard protocol without IV contrast.

[Series 2: stone full standard · axial · 0.87mm/px · z∈[-518,-48]mm · 13 of 104 slices shown, 15 images]
[im 5/104  soft-tissue]
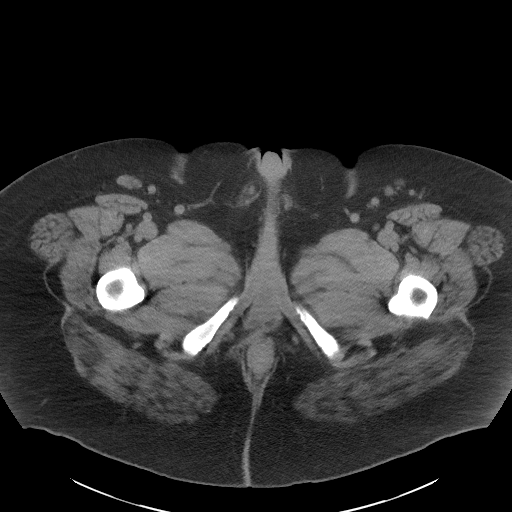
[im 5/104  bone]
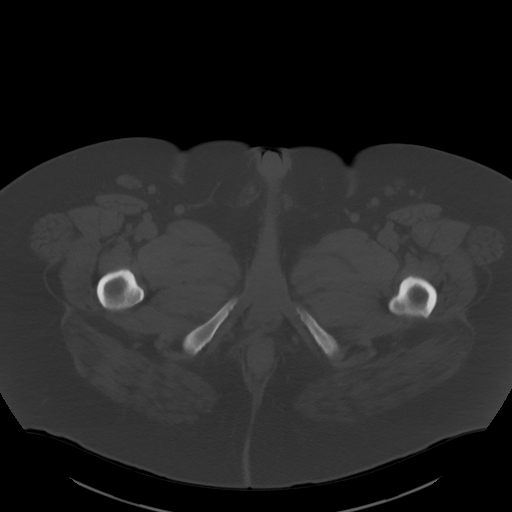
[im 14/104  soft-tissue]
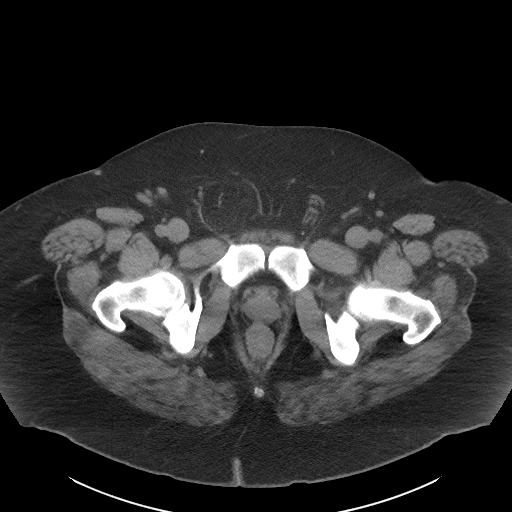
[im 23/104  soft-tissue]
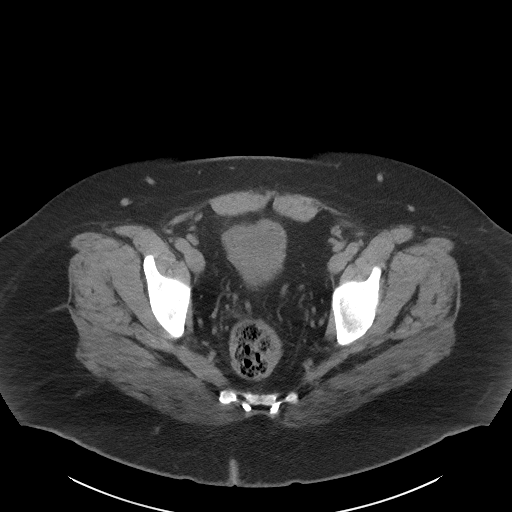
[im 27/104  soft-tissue]
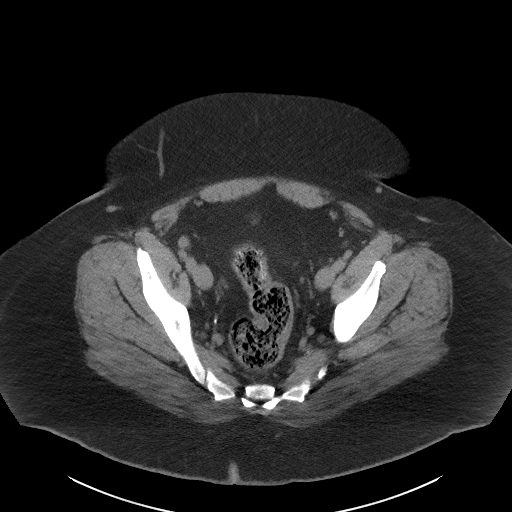
[im 36/104  soft-tissue]
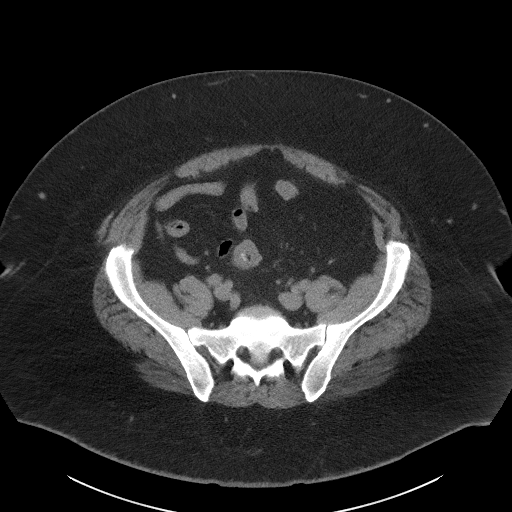
[im 45/104  soft-tissue]
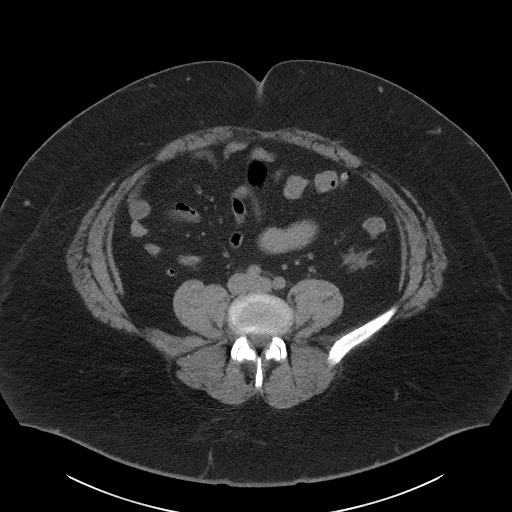
[im 54/104  soft-tissue]
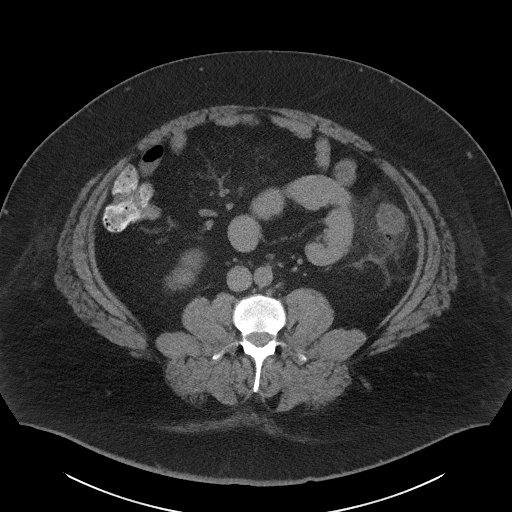
[im 59/104  soft-tissue]
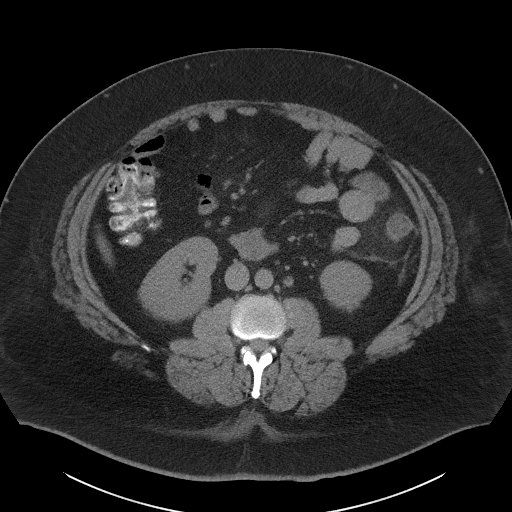
[im 68/104  soft-tissue]
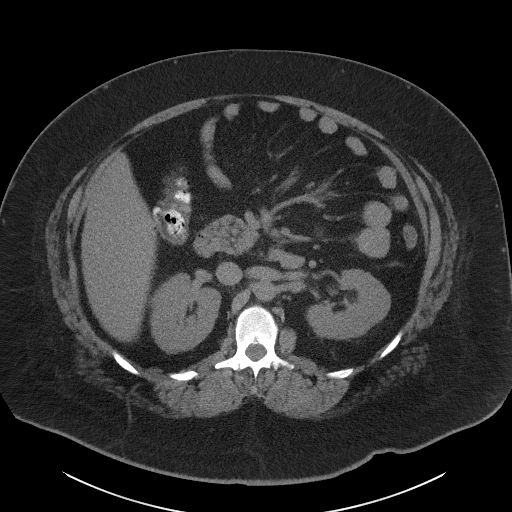
[im 68/104  bone]
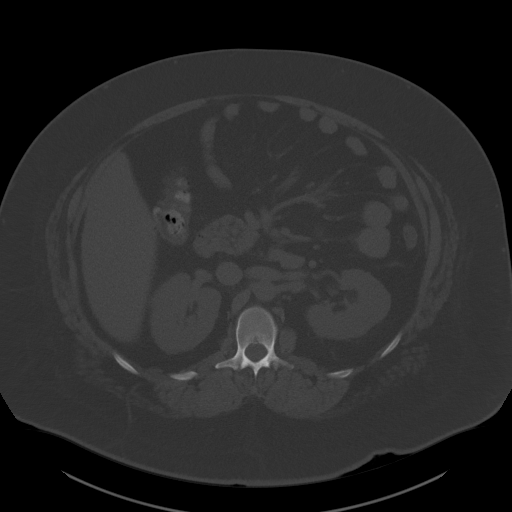
[im 77/104  soft-tissue]
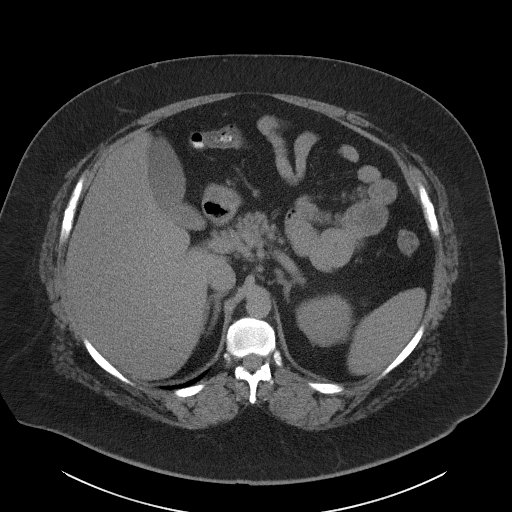
[im 81/104  soft-tissue]
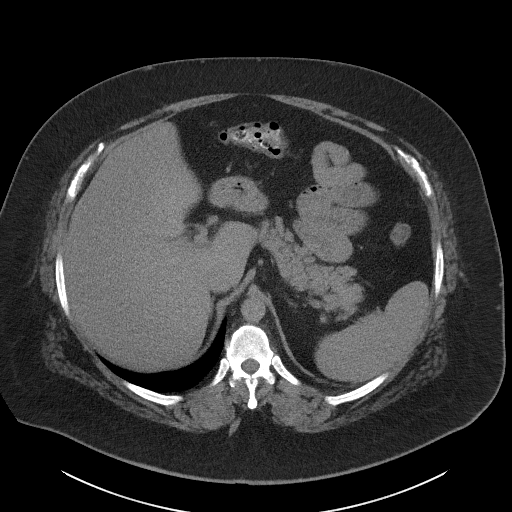
[im 90/104  soft-tissue]
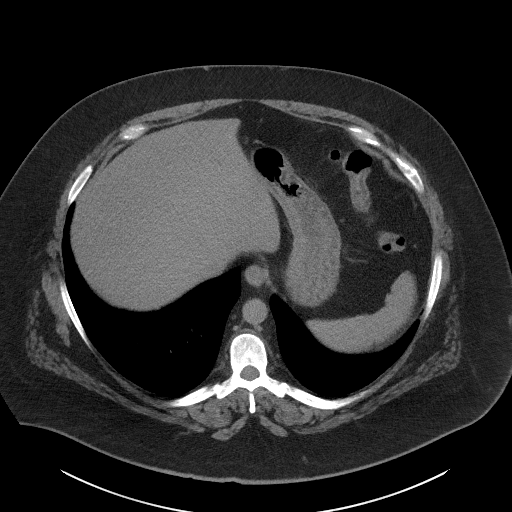
[im 99/104  soft-tissue]
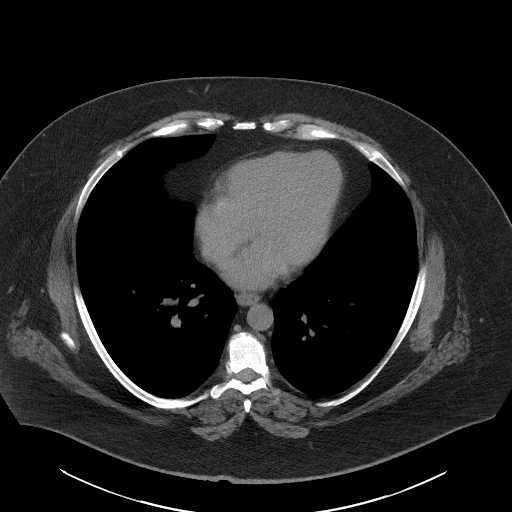

[Series 5: coronal · coronal · 0.90mm/px · 3 of 175 slices shown]
[im 59/175  soft-tissue]
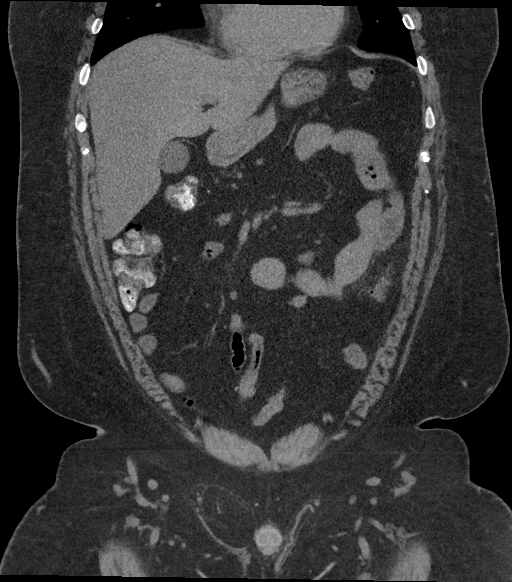
[im 78/175  soft-tissue]
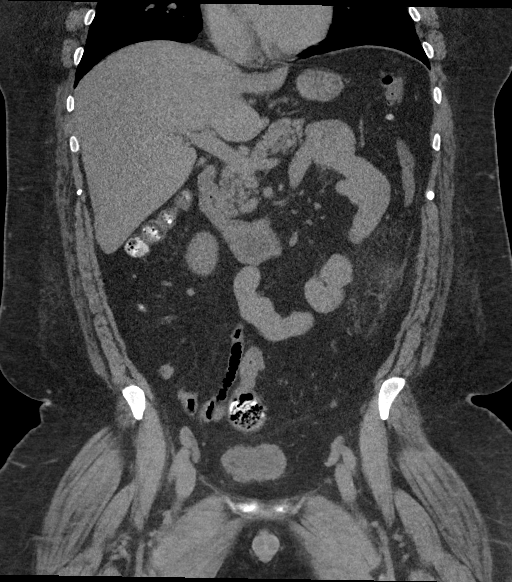
[im 97/175  soft-tissue]
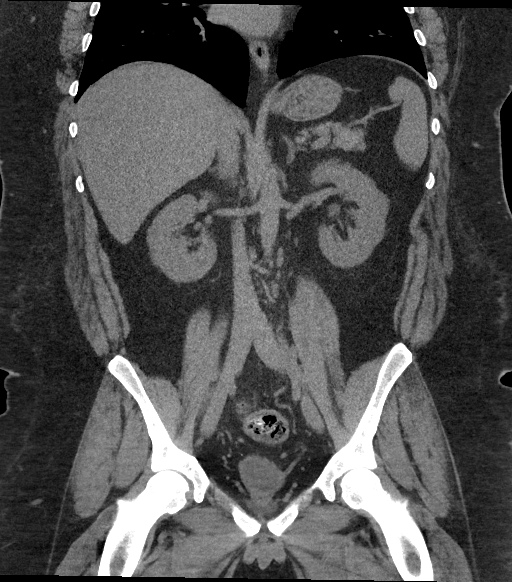

[16 of 46 positions shown; findings below may reference images not displayed]

FINDINGS: Lower chest: No acute abnormality.

Hepatobiliary: No focal liver abnormality is seen. No gallstones,
gallbladder wall thickening, or biliary dilatation.

Pancreas: Unremarkable. No pancreatic ductal dilatation or
surrounding inflammatory changes.

Spleen: Spleen is normal in size. Splenic calcified granuloma noted
superiorly. No other focal splenic abnormality.

Adrenals/Urinary Tract: Adrenal glands are unremarkable. Kidneys are
normal, without renal calculi, focal lesion, or hydronephrosis.
Bladder is unremarkable.

Stomach/Bowel: Negative for bowel obstruction, significant
dilatation, ileus, or free air. Normal appendix.

In the left abdomen, the mid descending colon demonstrates
diverticular disease with surrounding pericolonic inflammation/edema
compatible with acute diverticulitis. No evidence fluid collection,
abscess, hematoma, obstruction or perforation.

Vascular/Lymphatic: No significant vascular findings are present. No
enlarged abdominal or pelvic lymph nodes.

Reproductive: Prostate normal in size.  Seminal vesicles symmetric.

Other: Fat containing right inguinal hernia noted. No ascites.
Intact abdominal wall.

Musculoskeletal: No acute osseous finding.
IMPRESSION: Acute mid left descending colon diverticulitis. No other
complicating feature.

Negative for obstructing urinary tract or ureteral calculus. No
hydronephrosis or obstructive uropathy.

Fat containing right inguinal hernia

## 2021-09-18 IMAGING — CR DG CHEST 2V
1 series · 2 of 2 positions shown · non-contrast
Comparison: None.

CLINICAL DATA: Chest pain

EXAM:
CHEST - 2 VIEW

[Series 1: dg chest 2 view · 0.14mm/px · 2 of 2 slices shown]
[im 1/2]
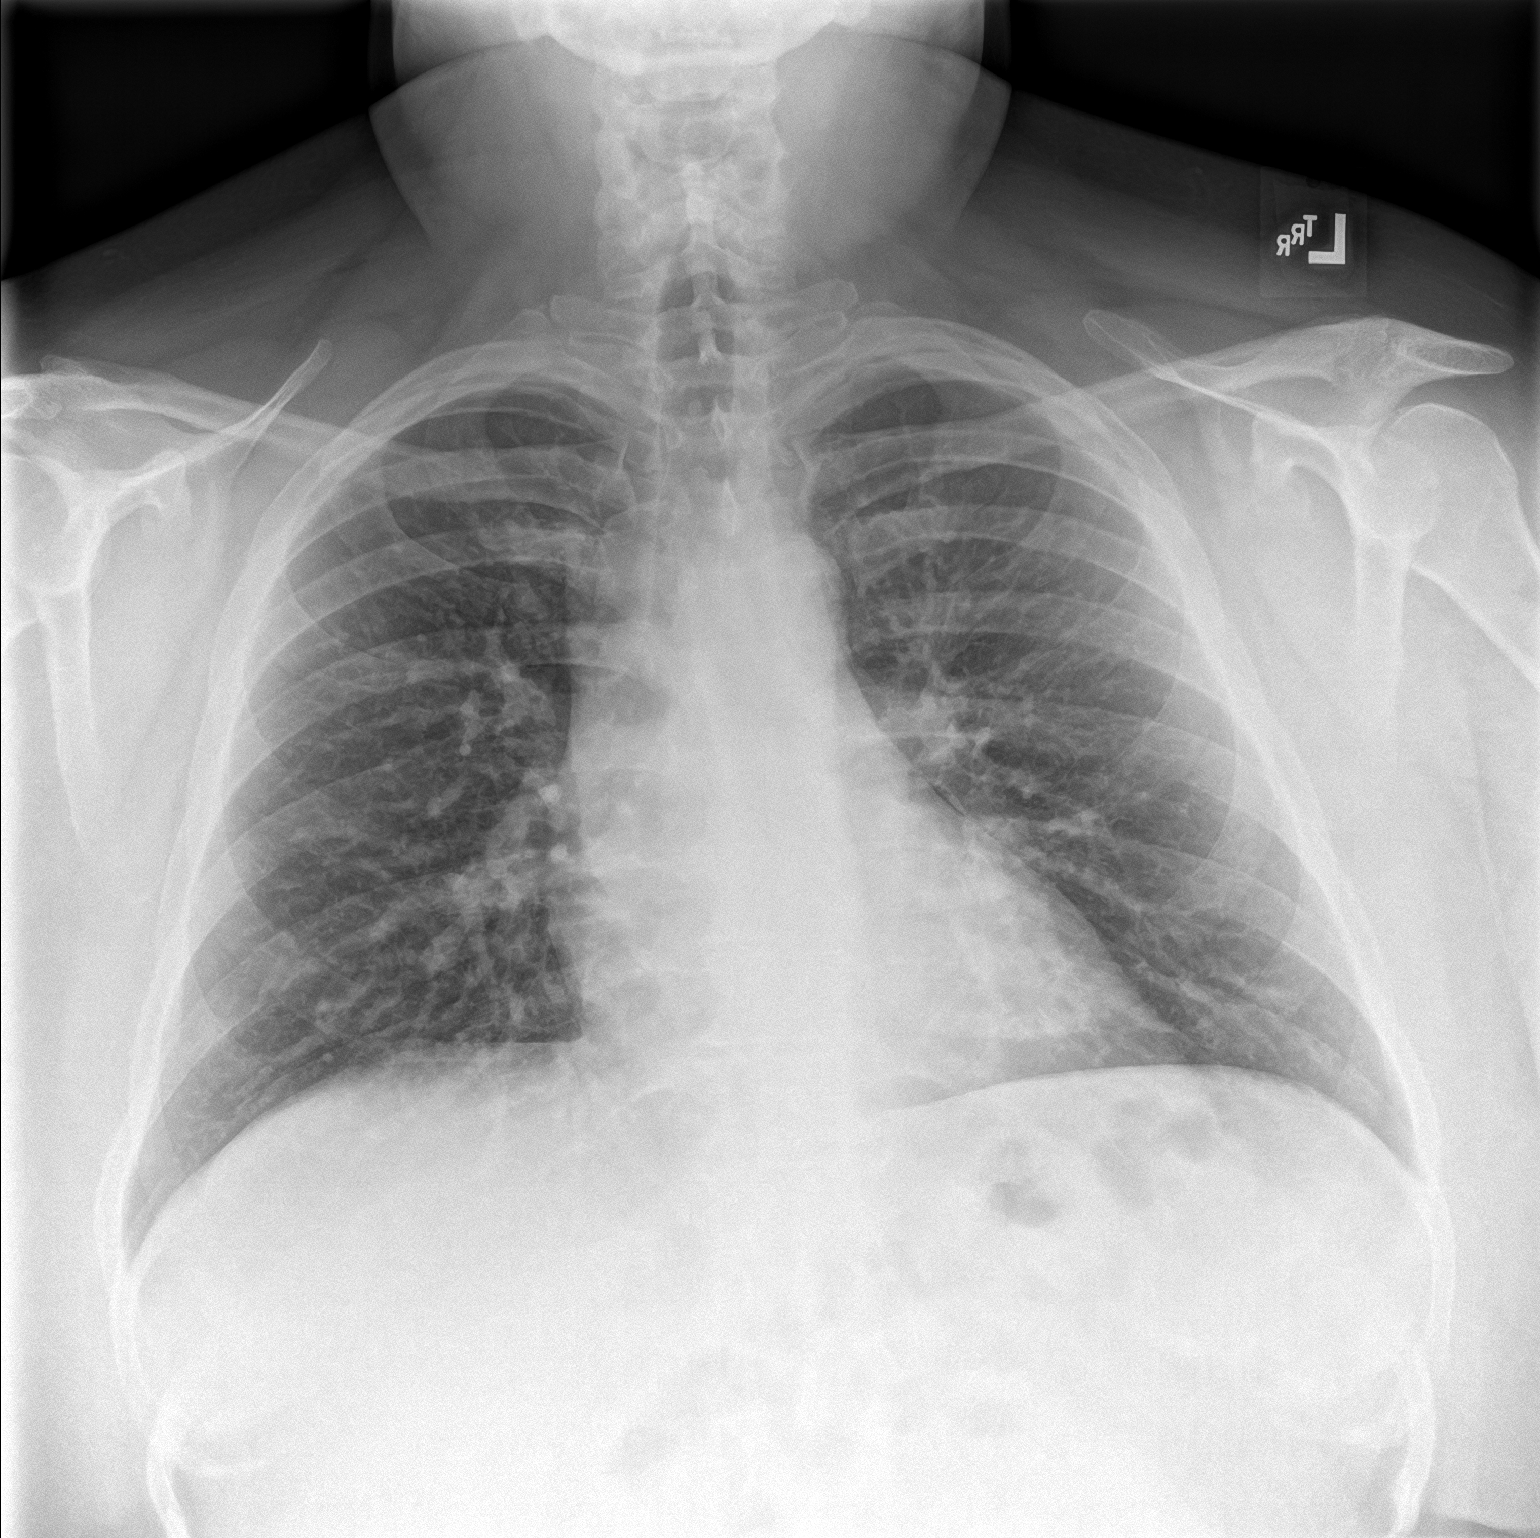
[im 2/2]
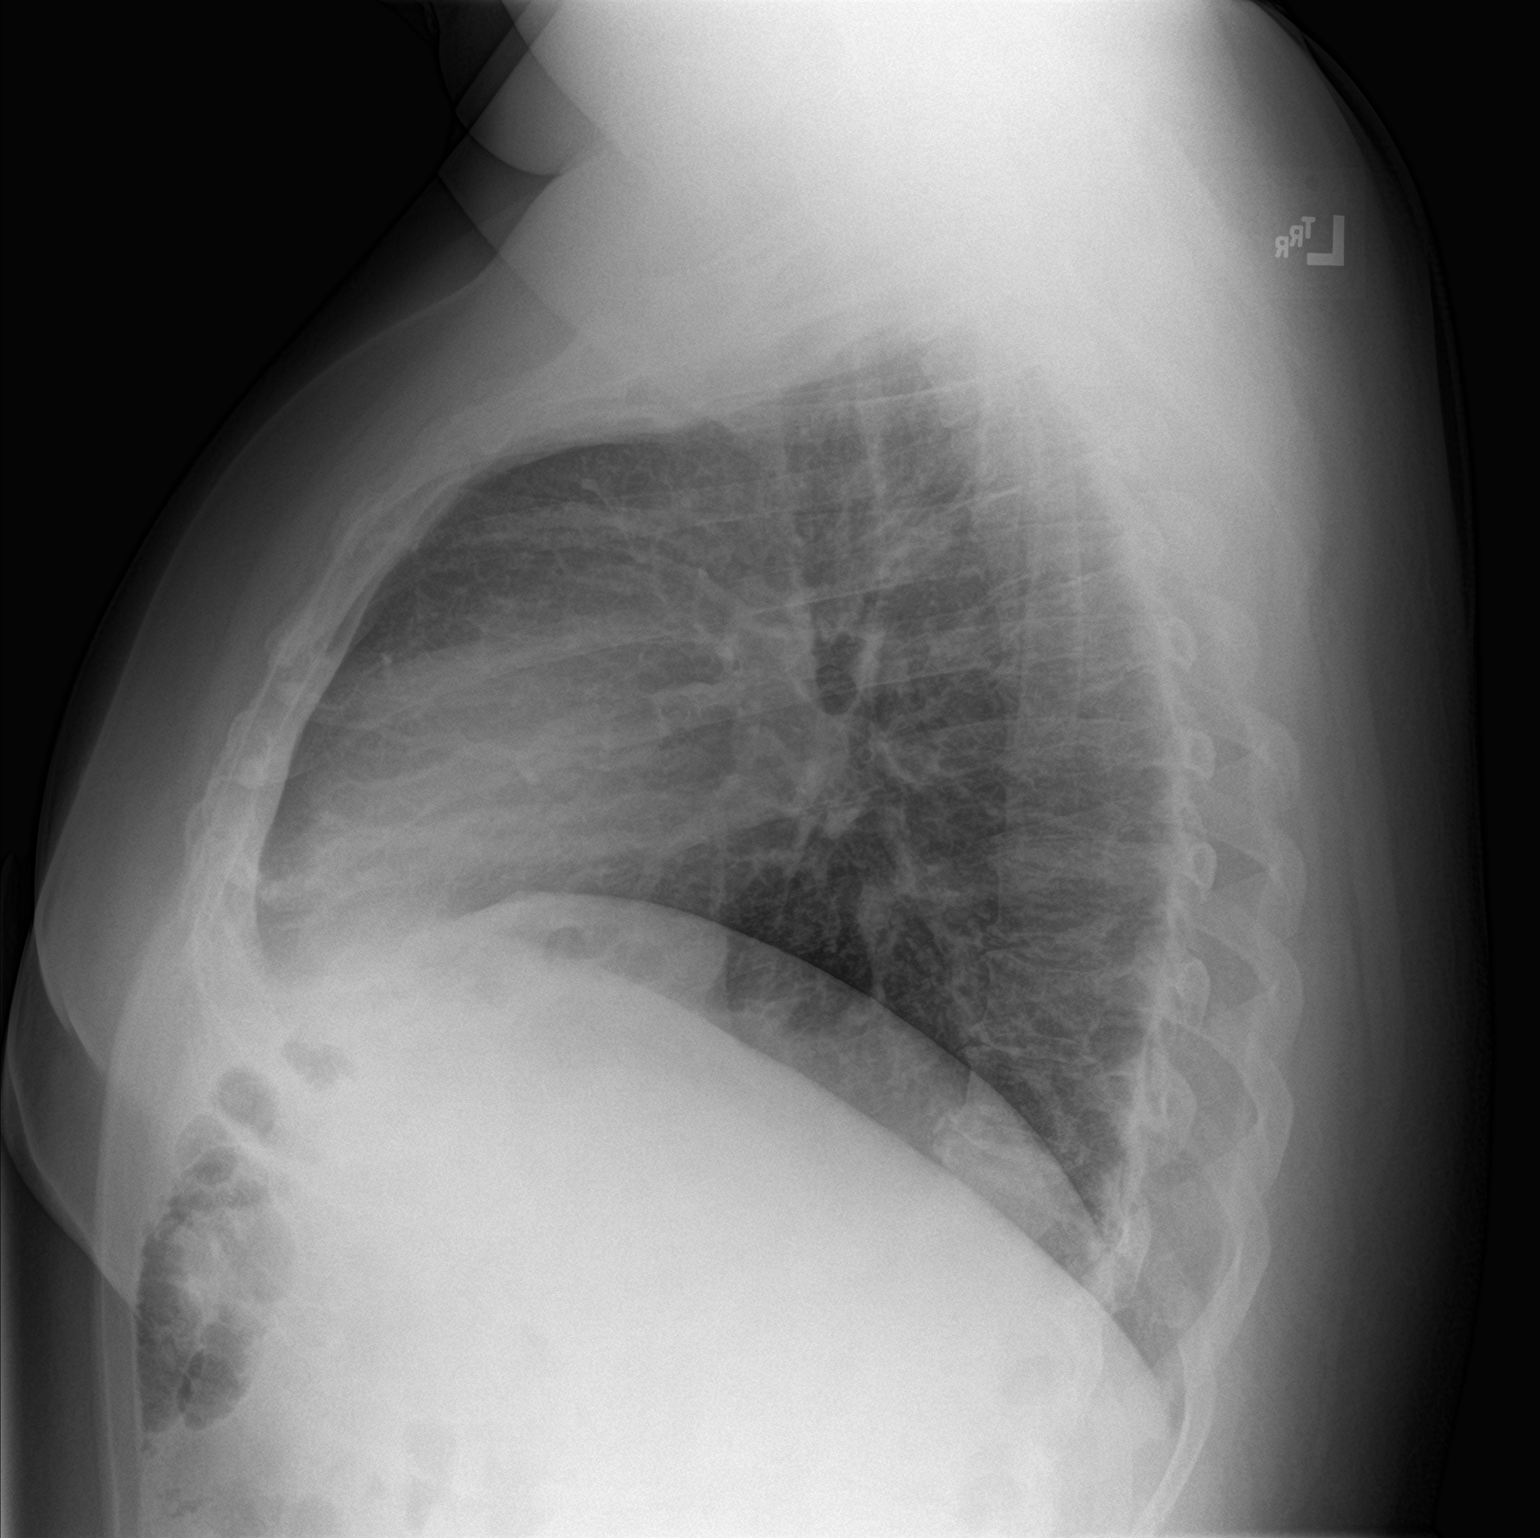

[2 of 2 positions shown; findings below may reference images not displayed]

FINDINGS: The heart size and mediastinal contours are within normal limits.
Both lungs are clear. The visualized skeletal structures are
unremarkable.
IMPRESSION: No active cardiopulmonary disease.

## 2022-05-11 ENCOUNTER — Telehealth: Payer: Self-pay | Admitting: Physician Assistant

## 2022-05-11 DIAGNOSIS — J101 Influenza due to other identified influenza virus with other respiratory manifestations: Secondary | ICD-10-CM

## 2022-05-11 MED ORDER — OSELTAMIVIR PHOSPHATE 75 MG PO CAPS: 75.0000 mg | ORAL_CAPSULE | Freq: Two times a day (BID) | ORAL | 0 refills | Status: DC

## 2022-05-11 NOTE — Progress Notes (Signed)

## 2022-05-13 HISTORY — PX: OTHER SURGICAL HISTORY: SHX169

## 2022-10-22 ENCOUNTER — Ambulatory Visit: Payer: Self-pay | Admitting: Surgery

## 2022-10-22 NOTE — H&P (Signed)
Subjective:    CC: Non-recurrent unilateral inguinal hernia without obstruction or gangrene [K40.90]   HPI:   Subjective Marcus Little is a 42 y.o. male who returns for evaluation of above.   Lost 150lbs since last year. Hernia still uncomfortable, and present.   Past Medical History:  has a past medical history of Obesity and Pyelonephritis.   Past Surgical History:  Past Surgical History       Past Surgical History:  Procedure Laterality Date   COLONOSCOPY   03/24/2018    Tubular adenoma of the colon/Repeat 52yrs/[facility]        Family History: family history includes No Known Problems in his mother.   Social History:  reports that he quit smoking about 9 years ago. He has never used smokeless tobacco. He reports that he does not drink alcohol. No history on file for drug use.   Current Medications: has a current medication list which includes the following prescription(s): oseltamivir.   Allergies:       Allergies as of 10/22/2022 - Reviewed 10/22/2022  Allergen Reaction Noted   Other Swelling 02/06/2018      ROS:  A 15 point review of systems was performed and pertinent positives and negatives noted in HPI   Objective:      Objective BP 130/76   Pulse 64   Ht 175.3 cm (5\' 9" )   Wt (!) 110.8 kg (244 lb 3.2 oz)   BMI 36.06 kg/m    Constitutional :  Alert, cooperative, no distress  Lymphatics/Throat:  Supple, no lymphadenopathy  Respiratory:  clear to auscultation bilaterally  Cardiovascular:  regular rate and rhythm  Gastrointestinal: soft, non-tender; bowel sounds normal; no masses,  no organomegaly. inguinal hernia noted.  moderate, reducible, no overlying skin changes, and RIGHT  Musculoskeletal: Steady gait and movement  Skin: Cool and moist  Psychiatric: Normal affect, non-agitated, not confused         LABS:  N/a    RADS: N/a Assessment:        Assessment Non-recurrent unilateral inguinal hernia without obstruction or gangrene [K40.90]  can now proceed with repair.   Plan:        Plan 1. Non-recurrent unilateral inguinal hernia without obstruction or gangrene [K40.90]   Discussed the risk of surgery including recurrence, which can be up to 50% in the case of incisional or complex hernias, possible use of prosthetic materials (mesh) and the increased risk of mesh infxn if used, bleeding, chronic pain, post-op infxn, post-op SBO or ileus, and possible re-operation to address said risks. The risks of general anesthetic, if used, includes MI, CVA, sudden death or even reaction to anesthetic medications also discussed. Alternatives include continued observation.  Benefits include possible symptom relief, prevention of incarceration, strangulation, enlargement in size over time, and the risk of emergency surgery in the face of strangulation.    Typical post-op recovery time of 3-5 days with 2 weeks of activity restrictions were also discussed.   ED return precautions given for sudden increase in pain, size of hernia with accompanying fever, nausea, and/or vomiting.   The patient verbalized understanding and all questions were answered to the patient's satisfaction.     2. Patient has elected to proceed with surgical treatment. Procedure will be scheduled. RIGHT, possible bilateral, robotic assisted laparoscopic   labs/images/medications/previous chart entries reviewed personally and relevant changes/updates noted above.

## 2022-11-12 ENCOUNTER — Other Ambulatory Visit: Payer: Self-pay

## 2022-11-12 ENCOUNTER — Encounter
Admission: RE | Admit: 2022-11-12 | Discharge: 2022-11-12 | Disposition: A | Discharge status: Home / Self Care | Payer: Managed Care, Other (non HMO) | Source: Ambulatory Visit | Attending: Surgery | Admitting: Surgery

## 2022-11-12 HISTORY — DX: Depression, unspecified: F32.A

## 2022-11-12 NOTE — Patient Instructions (Addendum)
Your procedure is scheduled on: 11/21/22 - Thursday Report to the Registration Desk on the 1st floor of the [facility]. To find out your arrival time, please call [phone] between 1PM - 3PM on: 11/20/22 - Wednesday If your arrival time is 6:00 am, do not arrive before that time as the [facility] entrance doors do not open until 6:00 am.  REMEMBER: Instructions that are not followed completely may result in serious medical risk, up to and including death; or upon the discretion of your surgeon and anesthesiologist your surgery may need to be rescheduled.  Do not eat food after midnight the night before surgery.  No gum chewing or hard candies.  You may however, drink CLEAR liquids up to 2 hours before you are scheduled to arrive for your surgery. Do not drink anything within 2 hours of your scheduled arrival time.  Clear liquids include: - water  - apple juice without pulp - gatorade (not RED colors) - black coffee or tea (Do NOT add milk or creamers to the coffee or tea) Do NOT drink anything that is not on this list.  One week prior to surgery: Stop Anti-inflammatories (NSAIDS) such as Advil, Aleve, Ibuprofen, Motrin, Naproxen, Naprosyn and Aspirin based products such as Excedrin, Goody's Powder, BC Powder. You may however, continue to take Tylenol if needed for pain up until the day of surgery.  Stop ANY OVER THE COUNTER supplements until after surgery.   TAKE ONLY THESE MEDICATIONS THE MORNING OF SURGERY WITH A SIP OF WATER:  none   No Alcohol for 24 hours before or after surgery.  No Smoking including e-cigarettes for 24 hours before surgery.  No chewable tobacco products for at least 6 hours before surgery.  No nicotine patches on the day of surgery.  Do not use any "recreational" drugs for at least a week (preferably 2 weeks) before your surgery.  Please be advised that the combination of cocaine and anesthesia may have negative outcomes, up to and including  death. If you test positive for cocaine, your surgery will be cancelled.  On the morning of surgery brush your teeth with toothpaste and water, you may rinse your mouth with mouthwash if you wish. Do not swallow any toothpaste or mouthwash.  Use CHG Soap or wipes as directed on instruction sheet.  Do not wear jewelry, make-up, hairpins, clips or nail polish.  Do not wear lotions, powders, or perfumes.   Do not shave body hair from the neck down 48 hours before surgery.  Contact lenses, hearing aids and dentures may not be worn into surgery.  Do not bring valuables to the hospital. [facility] is not responsible for any missing/lost belongings or valuables.   Notify your doctor if there is any change in your medical condition (cold, fever, infection).  Wear comfortable clothing (specific to your surgery type) to the hospital.  After surgery, you can help prevent lung complications by doing breathing exercises.  Take deep breaths and cough every 1-2 hours. Your doctor may order a device called an Incentive Spirometer to help you take deep breaths. When coughing or sneezing, hold a pillow firmly against your incision with both hands. This is called "splinting." Doing this helps protect your incision. It also decreases belly discomfort.  If you are being admitted to the hospital overnight, leave your suitcase in the car. After surgery it may be brought to your room.  In case of increased patient census, it may be necessary for you, the patient, to  continue your postoperative care in the Same Day Surgery department.  If you are being discharged the day of surgery, you will not be allowed to drive home. You will need a responsible individual to drive you home and stay with you for 24 hours after surgery.   If you are taking public transportation, you will need to have a responsible individual with you.  Please call the Pre-admissions Testing Dept. at [phone] if you have any  questions about these instructions.  Surgery Visitation Policy:  Patients having surgery or a procedure may have two visitors.  Children under the age of 23 must have an adult with them who is not the patient.  Inpatient Visitation:    Visiting hours are 7 a.m. to 8 p.m. Up to four visitors are allowed at one time in a patient room. The visitors may rotate out with other people during the day.  One visitor age 83 or older may stay with the patient overnight and must be in the room by 8 p.m.     Preparing for Surgery with CHLORHEXIDINE GLUCONATE (CHG) Soap  Chlorhexidine Gluconate (CHG) Soap  o An antiseptic cleaner that kills germs and bonds with the skin to continue killing germs even after washing  o Used for showering the night before surgery and morning of surgery  Before surgery, you can play an important role by reducing the number of germs on your skin.  CHG (Chlorhexidine gluconate) soap is an antiseptic cleanser which kills germs and bonds with the skin to continue killing germs even after washing.  Please do not use if you have an allergy to CHG or antibacterial soaps. If your skin becomes reddened/irritated stop using the CHG.  1. Shower the NIGHT BEFORE SURGERY and the MORNING OF SURGERY with CHG soap.  2. If you choose to wash your hair, wash your hair first as usual with your normal shampoo.  3. After shampooing, rinse your hair and body thoroughly to remove the shampoo.  4. Use CHG as you would any other liquid soap. You can apply CHG directly to the skin and wash gently with a scrungie or a clean washcloth.  5. Apply the CHG soap to your body only from the neck down. Do not use on open wounds or open sores. Avoid contact with your eyes, ears, mouth, and genitals (private parts). Wash face and genitals (private parts) with your normal soap.  6. Wash thoroughly, paying special attention to the area where your surgery will be performed.  7. Thoroughly rinse your  body with warm water.  8. Do not shower/wash with your normal soap after using and rinsing off the CHG soap.  9. Pat yourself dry with a clean towel.  10. Wear clean pajamas to bed the night before surgery.  12. Place clean sheets on your bed the night of your first shower and do not sleep with pets.  13. Shower again with the CHG soap on the day of surgery prior to arriving at the hospital.  14. Do not apply any deodorants/lotions/powders.  15. Please wear clean clothes to the hospital.

## 2022-11-21 ENCOUNTER — Other Ambulatory Visit: Payer: Self-pay

## 2022-11-21 ENCOUNTER — Encounter: Payer: Self-pay | Admitting: Surgery

## 2022-11-21 ENCOUNTER — Ambulatory Visit
Admission: RE | Admit: 2022-11-21 | Discharge: 2022-11-21 | Disposition: A | Discharge status: Home / Self Care | Payer: Managed Care, Other (non HMO) | Attending: Surgery | Admitting: Surgery

## 2022-11-21 ENCOUNTER — Ambulatory Visit: Payer: Managed Care, Other (non HMO) | Admitting: Registered Nurse

## 2022-11-21 ENCOUNTER — Encounter
Admission: RE | Disposition: A | Discharge status: Home / Self Care | Payer: Self-pay | Source: Home / Self Care | Attending: Surgery

## 2022-11-21 DIAGNOSIS — Z87891 Personal history of nicotine dependence: Secondary | ICD-10-CM | POA: Insufficient documentation

## 2022-11-21 DIAGNOSIS — Z09 Encounter for follow-up examination after completed treatment for conditions other than malignant neoplasm: Secondary | ICD-10-CM | POA: Insufficient documentation

## 2022-11-21 DIAGNOSIS — K4090 Unilateral inguinal hernia, without obstruction or gangrene, not specified as recurrent: Secondary | ICD-10-CM | POA: Insufficient documentation

## 2022-11-21 SURGERY — HERNIORRHAPHY, INGUINAL, ROBOT-ASSISTED, LAPAROSCOPIC
Anesthesia: General | Site: Inguinal

## 2022-11-21 MED ORDER — PROPOFOL 10 MG/ML IV BOLUS
INTRAVENOUS | Status: AC
  Filled 2022-11-21: qty 40

## 2022-11-21 MED ORDER — ONDANSETRON HCL 4 MG/2ML IJ SOLN
INTRAMUSCULAR | Status: AC
  Filled 2022-11-21: qty 2

## 2022-11-21 MED ORDER — BUPIVACAINE LIPOSOME 1.3 % IJ SUSP
INTRAMUSCULAR | Status: AC
  Filled 2022-11-21: qty 20

## 2022-11-21 MED ORDER — BUPIVACAINE HCL (PF) 0.5 % IJ SOLN
INTRAMUSCULAR | Status: AC
  Filled 2022-11-21: qty 30

## 2022-11-21 MED ORDER — GABAPENTIN 300 MG PO CAPS
ORAL_CAPSULE | ORAL | Status: AC
  Filled 2022-11-21: qty 1

## 2022-11-21 MED ORDER — HYDROCODONE-ACETAMINOPHEN 5-325 MG PO TABS: 1.0000 | ORAL_TABLET | Freq: Four times a day (QID) | ORAL | 0 refills | Status: DC | PRN

## 2022-11-21 MED ORDER — GLYCOPYRROLATE 0.2 MG/ML IJ SOLN
INTRAMUSCULAR | Status: AC
  Filled 2022-11-21: qty 1

## 2022-11-21 MED ORDER — CEFAZOLIN SODIUM-DEXTROSE 2-4 GM/100ML-% IV SOLN
2.0000 g | INTRAVENOUS | Status: AC
  Administered 2022-11-21: 2 g via INTRAVENOUS

## 2022-11-21 MED ORDER — CEFAZOLIN SODIUM-DEXTROSE 2-4 GM/100ML-% IV SOLN
INTRAVENOUS | Status: AC
  Filled 2022-11-21: qty 100

## 2022-11-21 MED ORDER — CHLORHEXIDINE GLUCONATE 0.12 % MT SOLN
OROMUCOSAL | Status: AC
  Filled 2022-11-21: qty 15

## 2022-11-21 MED ORDER — GABAPENTIN 300 MG PO CAPS
300.0000 mg | ORAL_CAPSULE | ORAL | Status: AC
  Administered 2022-11-21: 300 mg via ORAL

## 2022-11-21 MED ORDER — LIDOCAINE HCL (CARDIAC) PF 100 MG/5ML IV SOSY
PREFILLED_SYRINGE | INTRAVENOUS | Status: DC | PRN
  Administered 2022-11-21: 100 mg via INTRAVENOUS

## 2022-11-21 MED ORDER — DEXAMETHASONE SODIUM PHOSPHATE 10 MG/ML IJ SOLN
INTRAMUSCULAR | Status: AC
  Filled 2022-11-21: qty 1

## 2022-11-21 MED ORDER — PHENYLEPHRINE 80 MCG/ML (10ML) SYRINGE FOR IV PUSH (FOR BLOOD PRESSURE SUPPORT)
PREFILLED_SYRINGE | INTRAVENOUS | Status: AC
  Filled 2022-11-21: qty 10

## 2022-11-21 MED ORDER — PROPOFOL 10 MG/ML IV BOLUS
INTRAVENOUS | Status: DC | PRN
  Administered 2022-11-21: 200 mg via INTRAVENOUS

## 2022-11-21 MED ORDER — OXYCODONE HCL 5 MG PO TABS
ORAL_TABLET | ORAL | Status: AC
  Filled 2022-11-21: qty 1

## 2022-11-21 MED ORDER — ACETAMINOPHEN 500 MG PO TABS
1000.0000 mg | ORAL_TABLET | ORAL | Status: AC
  Administered 2022-11-21: 1000 mg via ORAL

## 2022-11-21 MED ORDER — LACTATED RINGERS IV SOLN: INTRAVENOUS | Status: DC

## 2022-11-21 MED ORDER — FENTANYL CITRATE (PF) 100 MCG/2ML IJ SOLN
INTRAMUSCULAR | Status: AC
  Filled 2022-11-21: qty 2

## 2022-11-21 MED ORDER — DEXAMETHASONE SODIUM PHOSPHATE 10 MG/ML IJ SOLN
INTRAMUSCULAR | Status: DC | PRN
  Administered 2022-11-21: 8 mg via INTRAVENOUS

## 2022-11-21 MED ORDER — ACETAMINOPHEN 500 MG PO TABS
ORAL_TABLET | ORAL | Status: AC
  Filled 2022-11-21: qty 2

## 2022-11-21 MED ORDER — MIDAZOLAM HCL 2 MG/2ML IJ SOLN
INTRAMUSCULAR | Status: DC | PRN
  Administered 2022-11-21: 2 mg via INTRAVENOUS

## 2022-11-21 MED ORDER — FENTANYL CITRATE (PF) 100 MCG/2ML IJ SOLN
INTRAMUSCULAR | Status: DC | PRN
  Administered 2022-11-21 (×2): 50 ug via INTRAVENOUS

## 2022-11-21 MED ORDER — EPINEPHRINE PF 1 MG/ML IJ SOLN
INTRAMUSCULAR | Status: AC
  Filled 2022-11-21: qty 1

## 2022-11-21 MED ORDER — CELECOXIB 200 MG PO CAPS
200.0000 mg | ORAL_CAPSULE | ORAL | Status: AC
  Administered 2022-11-21: 200 mg via ORAL

## 2022-11-21 MED ORDER — FAMOTIDINE 20 MG PO TABS
ORAL_TABLET | ORAL | Status: AC
  Filled 2022-11-21: qty 1

## 2022-11-21 MED ORDER — IBUPROFEN 800 MG PO TABS: 800.0000 mg | ORAL_TABLET | Freq: Three times a day (TID) | ORAL | 0 refills | Status: DC | PRN

## 2022-11-21 MED ORDER — ROCURONIUM BROMIDE 10 MG/ML (PF) SYRINGE
PREFILLED_SYRINGE | INTRAVENOUS | Status: AC
  Filled 2022-11-21: qty 10

## 2022-11-21 MED ORDER — MIDAZOLAM HCL 2 MG/2ML IJ SOLN
INTRAMUSCULAR | Status: AC
  Filled 2022-11-21: qty 2

## 2022-11-21 MED ORDER — ORAL CARE MOUTH RINSE: 15.0000 mL | Freq: Once | OROMUCOSAL | Status: AC

## 2022-11-21 MED ORDER — KETAMINE HCL 50 MG/5ML IJ SOSY
PREFILLED_SYRINGE | INTRAMUSCULAR | Status: AC
  Filled 2022-11-21: qty 5

## 2022-11-21 MED ORDER — SUGAMMADEX SODIUM 200 MG/2ML IV SOLN
INTRAVENOUS | Status: DC | PRN
  Administered 2022-11-21: 219.6 mg via INTRAVENOUS

## 2022-11-21 MED ORDER — LIDOCAINE HCL (PF) 2 % IJ SOLN
INTRAMUSCULAR | Status: AC
  Filled 2022-11-21: qty 5

## 2022-11-21 MED ORDER — GLYCOPYRROLATE 0.2 MG/ML IJ SOLN
INTRAMUSCULAR | Status: DC | PRN
  Administered 2022-11-21: .1 mg via INTRAVENOUS

## 2022-11-21 MED ORDER — ROCURONIUM BROMIDE 100 MG/10ML IV SOLN
INTRAVENOUS | Status: DC | PRN
  Administered 2022-11-21: 10 mg via INTRAVENOUS
  Administered 2022-11-21: 50 mg via INTRAVENOUS
  Administered 2022-11-21: 10 mg via INTRAVENOUS

## 2022-11-21 MED ORDER — KETAMINE HCL 10 MG/ML IJ SOLN
INTRAMUSCULAR | Status: DC | PRN
  Administered 2022-11-21: 20 mg via INTRAVENOUS
  Administered 2022-11-21: 10 mg via INTRAVENOUS
  Administered 2022-11-21: 20 mg via INTRAVENOUS

## 2022-11-21 MED ORDER — CHLORHEXIDINE GLUCONATE 0.12 % MT SOLN
15.0000 mL | Freq: Once | OROMUCOSAL | Status: AC
  Administered 2022-11-21: 15 mL via OROMUCOSAL

## 2022-11-21 MED ORDER — FENTANYL CITRATE (PF) 100 MCG/2ML IJ SOLN
25.0000 ug | INTRAMUSCULAR | Status: DC | PRN
  Administered 2022-11-21: 50 ug via INTRAVENOUS

## 2022-11-21 MED ORDER — CELECOXIB 200 MG PO CAPS
ORAL_CAPSULE | ORAL | Status: AC
  Filled 2022-11-21: qty 1

## 2022-11-21 MED ORDER — OXYCODONE HCL 5 MG PO TABS
5.0000 mg | ORAL_TABLET | Freq: Once | ORAL | Status: AC | PRN
  Administered 2022-11-21: 5 mg via ORAL

## 2022-11-21 MED ORDER — BUPIVACAINE-EPINEPHRINE 0.5% -1:200000 IJ SOLN
INTRAMUSCULAR | Status: DC | PRN
  Administered 2022-11-21: 20 mL

## 2022-11-21 MED ORDER — DOCUSATE SODIUM 100 MG PO CAPS: 100.0000 mg | ORAL_CAPSULE | Freq: Two times a day (BID) | ORAL | 0 refills | Status: AC | PRN

## 2022-11-21 MED ORDER — CHLORHEXIDINE GLUCONATE CLOTH 2 % EX PADS
6.0000 | MEDICATED_PAD | Freq: Once | CUTANEOUS | Status: AC
  Administered 2022-11-21: 6 via TOPICAL

## 2022-11-21 MED ORDER — FAMOTIDINE 20 MG PO TABS
20.0000 mg | ORAL_TABLET | Freq: Once | ORAL | Status: AC
  Administered 2022-11-21: 20 mg via ORAL

## 2022-11-21 MED ORDER — ONDANSETRON HCL 4 MG/2ML IJ SOLN
INTRAMUSCULAR | Status: DC | PRN
  Administered 2022-11-21: 4 mg via INTRAVENOUS

## 2022-11-21 MED ORDER — 0.9 % SODIUM CHLORIDE (POUR BTL) OPTIME
TOPICAL | Status: DC | PRN
  Administered 2022-11-21: 10 mL

## 2022-11-21 MED ORDER — KETOROLAC TROMETHAMINE 30 MG/ML IJ SOLN
INTRAMUSCULAR | Status: AC
  Filled 2022-11-21: qty 1

## 2022-11-21 MED ORDER — OXYCODONE HCL 5 MG/5ML PO SOLN: 5.0000 mg | Freq: Once | ORAL | Status: AC | PRN

## 2022-11-21 MED ORDER — BUPIVACAINE LIPOSOME 1.3 % IJ SUSP
INTRAMUSCULAR | Status: DC | PRN
  Administered 2022-11-21: 20 mL

## 2022-11-21 MED ORDER — ACETAMINOPHEN 325 MG PO TABS: 650.0000 mg | ORAL_TABLET | Freq: Three times a day (TID) | ORAL | 0 refills | Status: AC | PRN

## 2022-11-21 SURGICAL SUPPLY — 50 items
ADH SKN CLS APL DERMABOND .7 (GAUZE/BANDAGES/DRESSINGS) ×1
BAG PRESSURE INF REUSE 1000 (BAG) IMPLANT
BLADE SURG SZ11 CARB STEEL (BLADE) ×1 IMPLANT
BNDG GAUZE DERMACEA FLUFF 4 (GAUZE/BANDAGES/DRESSINGS) ×1 IMPLANT
BNDG GZE DERMACEA 4 6PLY (GAUZE/BANDAGES/DRESSINGS) ×1
COVER TIP SHEARS 8 DVNC (MISCELLANEOUS) ×1 IMPLANT
COVER WAND RF STERILE (DRAPES) ×1 IMPLANT
DERMABOND ADVANCED .7 DNX12 (GAUZE/BANDAGES/DRESSINGS) ×1 IMPLANT
DRAPE ARM DVNC X/XI (DISPOSABLE) ×3 IMPLANT
DRAPE COLUMN DVNC XI (DISPOSABLE) ×1 IMPLANT
ELECT CAUTERY BLADE 6.4 (BLADE) IMPLANT
ELECT REM PT RETURN 9FT ADLT (ELECTROSURGICAL) ×1
ELECTRODE REM PT RTRN 9FT ADLT (ELECTROSURGICAL) ×1 IMPLANT
FORCEPS BPLR FENES DVNC XI (FORCEP) ×1 IMPLANT
GLOVE BIOGEL PI IND STRL 7.0 (GLOVE) ×2 IMPLANT
GLOVE SURG SYN 6.5 ES PF (GLOVE) ×2 IMPLANT
GLOVE SURG SYN 6.5 PF PI (GLOVE) ×2 IMPLANT
GOWN STRL REUS W/ TWL LRG LVL3 (GOWN DISPOSABLE) ×3 IMPLANT
GOWN STRL REUS W/TWL LRG LVL3 (GOWN DISPOSABLE) ×3
IRRIGATOR SUCT 8 DISP DVNC XI (IRRIGATION / IRRIGATOR) IMPLANT
IV NS 1000ML (IV SOLUTION)
IV NS 1000ML BAXH (IV SOLUTION) IMPLANT
LABEL OR SOLS (LABEL) IMPLANT
MANIFOLD NEPTUNE II (INSTRUMENTS) ×1 IMPLANT
MESH 3DMAX MID 4X6 RT LRG (Mesh General) IMPLANT
NDL DRIVE SUT CUT DVNC (INSTRUMENTS) ×1 IMPLANT
NDL HYPO 22X1.5 SAFETY MO (MISCELLANEOUS) ×1 IMPLANT
NDL INSUFFLATION 14GA 120MM (NEEDLE) ×1 IMPLANT
NEEDLE DRIVE SUT CUT DVNC (INSTRUMENTS) ×1 IMPLANT
NEEDLE HYPO 22X1.5 SAFETY MO (MISCELLANEOUS) ×1 IMPLANT
NEEDLE INSUFFLATION 14GA 120MM (NEEDLE) ×1 IMPLANT
OBTURATOR OPTICAL STND 8 DVNC (TROCAR) ×1
OBTURATOR OPTICALSTD 8 DVNC (TROCAR) ×1 IMPLANT
PACK LAP CHOLECYSTECTOMY (MISCELLANEOUS) ×1 IMPLANT
PENCIL SMOKE EVACUATOR (MISCELLANEOUS) ×1 IMPLANT
SCISSORS MNPLR CVD DVNC XI (INSTRUMENTS) ×1 IMPLANT
SEAL UNIV 5-12 XI (MISCELLANEOUS) ×3 IMPLANT
SET TUBE SMOKE EVAC HIGH FLOW (TUBING) ×1 IMPLANT
SOL ELECTROSURG ANTI STICK (MISCELLANEOUS) ×1
SOLUTION ELECTROSURG ANTI STCK (MISCELLANEOUS) ×1 IMPLANT
SUT MNCRL 4-0 (SUTURE) ×1
SUT MNCRL 4-0 27XMFL (SUTURE) ×1
SUT VIC AB 2-0 SH 27 (SUTURE) ×1
SUT VIC AB 2-0 SH 27XBRD (SUTURE) ×1 IMPLANT
SUT VLOC 90 6 CV-15 VIOLET (SUTURE) ×2 IMPLANT
SUTURE MNCRL 4-0 27XMF (SUTURE) ×1 IMPLANT
SYR 30ML LL (SYRINGE) ×1 IMPLANT
TAPE TRANSPORE STRL 2 31045 (GAUZE/BANDAGES/DRESSINGS) ×1 IMPLANT
TRAP FLUID SMOKE EVACUATOR (MISCELLANEOUS) ×1 IMPLANT
WATER STERILE IRR 500ML POUR (IV SOLUTION) ×1 IMPLANT

## 2022-11-21 NOTE — Discharge Instructions (Addendum)
Hernia repair, Care After This sheet gives you information about how to care for yourself after your procedure. Your health care provider may also give you more specific instructions. If you have problems or questions, contact your health care provider. What can I expect after the procedure? After your procedure, it is common to have the following: Pain in your abdomen, especially in the incision areas. You will be given medicine to control the pain. Tiredness. This is a normal part of the recovery process. Your energy level will return to normal over the next several weeks. Changes in your bowel movements, such as constipation or needing to go more often. Talk with your health care provider about how to manage this. Follow these instructions at home: Medicines  tylenol and advil as needed for discomfort.  Please alternate between the two every four hours as needed for pain.    Use narcotics, if prescribed, only when tylenol and motrin is not enough to control pain.  325-650mg every 8hrs to max of 3000mg/24hrs (including the 325mg in every norco dose) for the tylenol.    Advil up to 800mg per dose every 8hrs as needed for pain.   PLEASE RECORD NUMBER OF PILLS TAKEN UNTIL NEXT FOLLOW UP APPT.  THIS WILL HELP DETERMINE HOW READY YOU ARE TO BE RELEASED FROM ANY ACTIVITY RESTRICTIONS Do not drive or use heavy machinery while taking prescription pain medicine. Do not drink alcohol while taking prescription pain medicine.  Incision care    Follow instructions from your health care provider about how to take care of your incision areas. Make sure you: Keep your incisions clean and dry. Wash your hands with soap and water before and after applying medicine to the areas, and before and after changing your bandage (dressing). If soap and water are not available, use hand sanitizer. Change your dressing as told by your health care provider. Leave stitches (sutures), skin glue, or adhesive strips in  place. These skin closures may need to stay in place for 2 weeks or longer. If adhesive strip edges start to loosen and curl up, you may trim the loose edges. Do not remove adhesive strips completely unless your health care provider tells you to do that. Do not wear tight clothing over the incisions. Tight clothing may rub and irritate the incision areas, which may cause the incisions to open. Do not take baths, swim, or use a hot tub until your health care provider approves. OK TO SHOWER IN 24HRS.   Check your incision area every day for signs of infection. Check for: More redness, swelling, or pain. More fluid or blood. Warmth. Pus or a bad smell. Activity Avoid lifting anything that is heavier than 10 lb (4.5 kg) for 2 weeks or until your health care provider says it is okay. No pushing/pulling greater than 30lbs You may resume normal activities as told by your health care provider. Ask your health care provider what activities are safe for you. Take rest breaks during the day as needed. Eating and drinking Follow instructions from your health care provider about what you can eat after surgery. To prevent or treat constipation while you are taking prescription pain medicine, your health care provider may recommend that you: Drink enough fluid to keep your urine clear or pale yellow. Take over-the-counter or prescription medicines. Eat foods that are high in fiber, such as fresh fruits and vegetables, whole grains, and beans. Limit foods that are high in fat and processed sugars, such as fried and   sweet foods. General instructions Ask your health care provider when you will need an appointment to get your sutures or staples removed. Keep all follow-up visits as told by your health care provider. This is important. Contact a health care provider if: You have more redness, swelling, or pain around your incisions. You have more fluid or blood coming from the incisions. Your incisions feel  warm to the touch. You have pus or a bad smell coming from your incisions or your dressing. You have a fever. You have an incision that breaks open (edges not staying together) after sutures or staples have been removed. You develop a rash. You have chest pain or difficulty breathing. You have pain or swelling in your legs. You feel light-headed or you faint. Your abdomen swells (becomes distended). You have nausea or vomiting. You have blood in your stool (feces). This information is not intended to replace advice given to you by your health care provider. Make sure you discuss any questions you have with your health care provider. Document Released: 11/16/2004 Document Revised: 01/16/2018 Document Reviewed: 01/29/2016 Elsevier Interactive Patient Education  2019 [employer]    AMBULATORY SURGERY  DISCHARGE INSTRUCTIONS   The drugs that you were given will stay in your system until tomorrow so for the next 24 hours you should not:  Drive an automobile Make any legal decisions Drink any alcoholic beverage   You may resume regular meals tomorrow.  Today it is better to start with liquids and gradually work up to solid foods.  You may eat anything you prefer, but it is better to start with liquids, then soup and crackers, and gradually work up to solid foods.   Please notify your doctor immediately if you have any unusual bleeding, trouble breathing, redness and pain at the surgery site, drainage, fever, or pain not relieved by medication.    Your post-operative visit with Dr.                                       is: Date:                        Time:    Please call to schedule your post-operative visit.  Additional Instructions:  

## 2022-11-21 NOTE — Interval H&P Note (Signed)
No change. Ok to proceed

## 2022-11-21 NOTE — Transfer of Care (Signed)
Immediate Anesthesia Transfer of Care Note  Patient: Marcus Little Due  Procedure(s) Performed: XI ROBOTIC ASSISTED INGUINAL HERNIA w/ mesh (Inguinal)  Patient Location: PACU  Anesthesia Type:General  Level of Consciousness: awake, alert , and oriented  Airway & Oxygen Therapy: Patient Spontanous Breathing and Patient connected to face mask oxygen  Post-op Assessment: Report given to RN and Post -op Vital signs reviewed and stable  Post vital signs: stable  Last Vitals:  Vitals Value Taken Time  BP 95/61 11/21/22 1546  Temp    Pulse 51 11/21/22 1549  Resp 18 11/21/22 1549  SpO2 99 % 11/21/22 1549  Vitals shown include unfiled device data.  Last Pain:  Vitals:   11/21/22 1325  TempSrc: Temporal  PainSc: 0-No pain         Complications: No notable events documented.

## 2022-11-21 NOTE — Anesthesia Preprocedure Evaluation (Signed)
Anesthesia Evaluation  Patient identified by MRN, date of birth, ID band Patient awake    Reviewed: Allergy & Precautions, NPO status , Patient's Chart, lab work & pertinent test results  Airway Mallampati: III  TM Distance: >3 FB Neck ROM: full    Dental  (+) Chipped   Pulmonary neg pulmonary ROS, Patient abstained from smoking., former smoker   Pulmonary exam normal        Cardiovascular negative cardio ROS Normal cardiovascular exam     Neuro/Psych negative neurological ROS  negative psych ROS   GI/Hepatic negative GI ROS, Neg liver ROS,,,  Endo/Other  negative endocrine ROS    Renal/GU      Musculoskeletal   Abdominal   Peds  Hematology negative hematology ROS (+)   Anesthesia Other Findings Past Medical History: No date: Depression 01/11/2018: Diverticulitis     Comment:  Inpt  No date: Prediabetes No date: Pyelonephritis  Past Surgical History: 03/24/2018: COLONOSCOPY WITH PROPOFOL; N/A     Comment:  Procedure: COLONOSCOPY WITH PROPOFOL;  Surgeon: [place],               Boykin Nearing, MD;  Location: [facility] ENDOSCOPY;  Service:               Gastroenterology;  Laterality: N/A;  BMI    Body Mass Index: 36.80 kg/m      Reproductive/Obstetrics negative OB ROS                             Anesthesia Physical Anesthesia Plan  ASA: 2  Anesthesia Plan: General ETT and General   Post-op Pain Management:    Induction: Intravenous  PONV Risk Score and Plan: Ondansetron, Dexamethasone and Midazolam  Airway Management Planned: Oral ETT  Additional Equipment:   Intra-op Plan:   Post-operative Plan: Extubation in OR  Informed Consent: I have reviewed the patients History and Physical, chart, labs and discussed the procedure including the risks, benefits and alternatives for the proposed anesthesia with the patient or authorized representative who has indicated his/her  understanding and acceptance.     Dental Advisory Given  Plan Discussed with: Anesthesiologist, CRNA and Surgeon  Anesthesia Plan Comments: (Patient consented for risks of anesthesia including but not limited to:  - adverse reactions to medications - damage to eyes, teeth, lips or other oral mucosa - nerve damage due to positioning  - sore throat or hoarseness - Damage to heart, brain, nerves, lungs, other parts of body or loss of life  Patient voiced understanding.)       Anesthesia Quick Evaluation

## 2022-11-21 NOTE — Op Note (Signed)
Preoperative diagnosis: right, initial, reducible inguinal Hernia.  Postoperative diagnosis: right initial reducible inguinal Hernia  Procedure: Robotic assisted laparoscopic right inguinal hernia repair with mesh  Anesthesia: General  Surgeon: Dr. Tonna Boehringer  Wound Classification: Clean  Specimen: none  Complications: None  Estimated Blood Loss: 10mL   Indications:  inguinal hernia. Repair was indicated to avoid complications of incarceration, obstruction and pain, and a prosthetic mesh repair was elected.  See H&P for further details.  Findings: 1. Vas Deferens and cord structures identified and preserved 2. Bard 3D max medium weight mesh used for repair 3. Adequate hemostasis achieved  Description of procedure: The patient was taken to the operating room. A time-out was completed verifying correct patient, procedure, site, positioning, and implant(s) and/or special equipment prior to beginning this procedure.  Area was prepped and draped in the usual sterile fashion. An incision was marked 20 cm above the pubic tubercle, slightly above the umbilicus  Scrotum wrapped in Kerlix roll.  Veress needle inserted at palmer's point.  Saline drop test noted to be positive with gradual increase in pressure after initiation of gas insufflation.  15 mm of pressure was achieved prior to removing the Veress needle and then placing a 8 mm port via the Optiview technique through the supraumbilical site.  Inspection of the area afterwards noted no injury to the surrounding organs during insertion of the needle and the port.  2 port sites were marked 8 cm to the lateral sides of the initial port, and a 8 mm robotic port was placed on the left side, another 8 mm robotic port on the right side under direct supervision.  Local anesthesia  infused to the preplanned incision sites prior to insertion of the port.  The [employer] platform was then brought into the operative field and docked to the  ports.  Examination of the abdominal cavity noted a right inguinal hernia.  A peritoneal flap was created approximately 8cm cephalad to the defect by using scissors with electrocautery.  Dissection was carried down towards the pubic tubercle, developing the myopectineal orifice view.  Laterally the flap was carried towards the ASIS.  large hernia sac with adjacent lipoma, possible portion of bladder was noted, which carefully dissected away from the adjacent tissues to be fully reduced out of hernia cavity.  Any bleeding was controlled with combination of electrocautery and manual pressure.    After confirming adequate dissection and the peritoneal reflection completely down and away from the cord structures, a Large Bard 3DMax medium weight mesh was placed within the anterior abdominal wall, secured in place using 2-0 Vicryl on an SH needle immediately above the pubic tubercle.  After noting proper placement of the mesh with the peritoneal reflection deep to it, the previously created peritoneal flap was secured back up to the anterior abdominal wall using running 3-0 V-Lock.  Both needles were then removed out of the abdominal cavity, Xi platform undocked from the ports and removed off of operative field.  exparel infused as ilioinguinal block.  Abdomen then desufflated and ports removed. All the skin incisions were then closed with a subcuticular stitch of Monocryl 4-0. Dermabond was applied. The testis was gently pulled down into its anatomic position in the scrotum.  The patient tolerated the procedure well and was taken to the postanesthesia care unit in stable condition. Sponge and instrument count correct at end of procedure.

## 2022-11-21 NOTE — H&P (Signed)
Subjective:   CC: Non-recurrent unilateral inguinal hernia without obstruction or gangrene [K40.90]  HPI: Marcus Little is a 42 y.o. male who returns for evaluation of above.  Lost 150lbs since last year. Hernia still uncomfortable, and present.  Past Medical History: has a past medical history of Obesity and Pyelonephritis.  Past Surgical History:  Past Surgical History:  Procedure Laterality Date  COLONOSCOPY 03/24/2018  Tubular adenoma of the colon/Repeat 42yrs/TKT   Family History: family history includes No Known Problems in his mother.  Social History: reports that he quit smoking about 9 years ago. He has never used smokeless tobacco. He reports that he does not drink alcohol. No history on file for drug use.  Current Medications: has a current medication list which includes the following prescription(s): oseltamivir.  Allergies:  Allergies as of 10/22/2022 - Reviewed 10/22/2022  Allergen Reaction Noted  Other Swelling 02/06/2018   ROS:  A 15 point review of systems was performed and pertinent positives and negatives noted in HPI  Objective:    BP 130/76  Pulse 64  Ht 175.3 cm (5\' 9" )  Wt (!) 110.8 kg (244 lb 3.2 oz)  BMI 36.06 kg/m   Constitutional : Alert, cooperative, no distress  Lymphatics/Throat: Supple, no lymphadenopathy  Respiratory: clear to auscultation bilaterally  Cardiovascular: regular rate and rhythm  Gastrointestinal: soft, non-tender; bowel sounds normal; no masses, no organomegaly. inguinal hernia noted. moderate, reducible, no overlying skin changes, and RIGHT  Musculoskeletal: Steady gait and movement  Skin: Cool and moist  Psychiatric: Normal affect, non-agitated, not confused    LABS:  N/a   RADS: N/a Assessment:    Non-recurrent unilateral inguinal hernia without obstruction or gangrene [K40.90] can now proceed with repair.  Plan:    1. Non-recurrent unilateral inguinal hernia without obstruction or gangrene [K40.90]   Discussed the risk of surgery including recurrence, which can be up to 50% in the case of incisional or complex hernias, possible use of prosthetic materials (mesh) and the increased risk of mesh infxn if used, bleeding, chronic pain, post-op infxn, post-op SBO or ileus, and possible re-operation to address said risks. The risks of general anesthetic, if used, includes MI, CVA, sudden death or even reaction to anesthetic medications also discussed. Alternatives include continued observation. Benefits include possible symptom relief, prevention of incarceration, strangulation, enlargement in size over time, and the risk of emergency surgery in the face of strangulation.   Typical post-op recovery time of 3-5 days with 2 weeks of activity restrictions were also discussed.  ED return precautions given for sudden increase in pain, size of hernia with accompanying fever, nausea, and/or vomiting.  The patient verbalized understanding and all questions were answered to the patient's satisfaction.  2. Patient has elected to proceed with surgical treatment. Procedure will be scheduled. RIGHT, possible bilateral, robotic assisted laparoscopic

## 2022-11-21 NOTE — Anesthesia Postprocedure Evaluation (Signed)
Anesthesia Post Note  Patient: Marcus Little  Procedure(s) Performed: XI ROBOTIC ASSISTED INGUINAL HERNIA w/ mesh (Inguinal)  Patient location during evaluation: PACU Anesthesia Type: General Level of consciousness: awake and alert Pain management: pain level controlled Vital Signs Assessment: post-procedure vital signs reviewed and stable Respiratory status: spontaneous breathing, nonlabored ventilation, respiratory function stable and patient connected to nasal cannula oxygen Cardiovascular status: blood pressure returned to baseline and stable Postop Assessment: no apparent nausea or vomiting Anesthetic complications: no   No notable events documented.   Last Vitals:  Vitals:   11/21/22 1630 11/21/22 1653  BP: 116/67 109/75  Pulse: (!) 50 (!) 54  Resp: 14 16  Temp: (!) 35.9 C 37.1 C  SpO2: 99% 96%    Last Pain:  Vitals:   11/21/22 1653  TempSrc: Temporal  PainSc: 3                  Marcus Little

## 2022-12-06 ENCOUNTER — Encounter: Payer: Self-pay | Admitting: Surgery

## 2023-12-30 ENCOUNTER — Ambulatory Visit (INDEPENDENT_AMBULATORY_CARE_PROVIDER_SITE_OTHER)

## 2023-12-30 ENCOUNTER — Ambulatory Visit: Admitting: Nurse Practitioner

## 2023-12-30 VITALS — BP 116/78 | HR 61 | Temp 98.1°F | Ht 68.0 in | Wt 216.6 lb

## 2023-12-30 DIAGNOSIS — M25561 Pain in right knee: Secondary | ICD-10-CM

## 2023-12-30 DIAGNOSIS — Z23 Encounter for immunization: Secondary | ICD-10-CM

## 2023-12-30 DIAGNOSIS — G8929 Other chronic pain: Secondary | ICD-10-CM

## 2023-12-30 DIAGNOSIS — R7303 Prediabetes: Secondary | ICD-10-CM | POA: Diagnosis not present

## 2023-12-30 DIAGNOSIS — M25562 Pain in left knee: Secondary | ICD-10-CM

## 2023-12-30 DIAGNOSIS — Z114 Encounter for screening for human immunodeficiency virus [HIV]: Secondary | ICD-10-CM

## 2023-12-30 DIAGNOSIS — K5792 Diverticulitis of intestine, part unspecified, without perforation or abscess without bleeding: Secondary | ICD-10-CM

## 2023-12-30 DIAGNOSIS — Z1159 Encounter for screening for other viral diseases: Secondary | ICD-10-CM

## 2023-12-30 DIAGNOSIS — K219 Gastro-esophageal reflux disease without esophagitis: Secondary | ICD-10-CM

## 2023-12-30 NOTE — Progress Notes (Signed)
 New Patient Office Visit  Subjective   Patient ID: Marcus Little, male    DOB: September 09, 1980  Age: 43 y.o. MRN: [id number]  CC:  Chief Complaint  Patient presents with   Establish Care   Annual Exam    HPI Marcus Little presents to establish care. He has not seen by PCP ina awhile. He is accompanied by his significant other, who is the practice lead at [employer]  He was diagnosed with diverticulitis in 2022, with the last flare-up occurring two years ago. He identifies seeds and nuts as dietary triggers and has increased his fiber intake to manage symptoms.  He experiences chronic knee pain since high school, affecting both knees, especially after long walks. He was previously advised to undergo knee replacement surgery but was recommended to lose weight first. He has since lost significant weight, now at 216 pounds. He experiences pain particularly after long work shifts but not with regularly active.  He was prediabetes but has lost weight and no longer takes metformin. He has not had an A1c test in several years. He follows a keto diet, high in protein and vegetables, and consumes a lot of sugar.  He experiences occasional heartburn, about once a month, which was more frequent when he was heavier. He used to take Tums but finds it less necessary now.  He has h/o depression, stable at present without medication.    He sleeps about five hours a night and does not feel fully rested. 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   He uses delta-8 or CBD and experiences occasional headaches related to caffeine withdrawal or allergies, treated with over-the-counter medications.   Health Maintenance  Topic Date Due   Hepatitis C Screening  Never done   Hepatitis B Vaccines 19-59 Average Risk (1 of 3 - 19+ 3-dose series) Never done   HPV VACCINES (1 - 3-dose SCDM series) Never done    COVID-19 Vaccine (3 - 2024-25 season) 01/14/2024 (Originally 01/12/2023)   INFLUENZA VACCINE  08/10/2024 (Originally 12/12/2023)   Colonoscopy  03/24/2028   DTaP/Tdap/Td (5 - Td or Tdap) 12/29/2033   HIV Screening  Completed   Pneumococcal Vaccine  Aged Out   Meningococcal B Vaccine  Aged Out       Topic Date Due   Hepatitis B Vaccines 19-59 Average Risk (1 of 3 - 19+ 3-dose series) Never done   HPV VACCINES (1 - 3-dose SCDM series) Never done    Outpatient Encounter Medications as of 12/30/2023  Medication Sig   HYDROcodone -acetaminophen  (NORCO) 5-325 MG tablet Take 1 tablet by mouth every 6 (six) hours as needed for up to 6 doses for moderate pain.   ibuprofen  (ADVIL ) 800 MG tablet Take 1 tablet (800 mg total) by mouth every 8 (eight) hours as needed for mild pain or moderate pain.   No facility-administered encounter medications on file as of 12/30/2023.    Past Medical History:  Diagnosis Date   Arthritis    Depression    Diabetes mellitus without complication (HCC)    Diverticulitis 01/11/2018   Inpt    GERD (gastroesophageal reflux disease)    Prediabetes    Pyelonephritis    Seizures (HCC)     Past Surgical History:  Procedure Laterality Date   COLONOSCOPY WITH PROPOFOL  N/A 03/24/2018   Procedure: COLONOSCOPY WITH PROPOFOL ;  Surgeon: Toledo, Ladell POUR, MD;  Location: [facility];  Service: Gastroenterology;  Laterality:  N/A;   Hernia mesh repair surgery  2024    Family History  Problem Relation Age of Onset   Drug abuse Mother    Depression Mother    COPD Mother    Arthritis Mother    Cancer Mother        spleen   Kidney disease Father    Arthritis Father    Alcohol abuse Father    Cancer Maternal Grandmother        breast cancer    Social History   Socioeconomic History   Marital status: Single    Spouse name: emily   Number of children: 1   Years of education: Not on file   Highest education level: Not on file  Occupational History   Not on  file  Tobacco Use   Smoking status: Former    Types: Cigars   Smokeless tobacco: Never  Vaping Use   Vaping status: Every Day   Substances: Nicotine, Flavoring  Substance and Sexual Activity   Alcohol use: Not Currently   Drug use: Not Currently    Types: Marijuana    Comment: Sat last use 03/21/18   Sexual activity: Not on file  Other Topics Concern   Not on file  Social History Narrative   Not on file   Social Drivers of Health   Financial Resource Strain: Not on file  Food Insecurity: Not on file  Transportation Needs: Not on file  Physical Activity: Not on file  Stress: Not on file  Social Connections: Not on file  Intimate Partner Violence: Not on file    ROS Negative unless indicated in HPI.      Objective    BP 116/78   Pulse 61   Temp 98.1 F (36.7 C)   Ht 5' 8 (1.727 m)   Wt 216 lb 9.6 oz (98.2 kg)   SpO2 98%   BMI 32.93 kg/m   Physical Exam Constitutional:      Appearance: Normal appearance.  HENT:     Right Ear: Tympanic membrane normal.     Left Ear: Tympanic membrane normal.     Mouth/Throat:     Comments: Pt declined Eyes:     Conjunctiva/sclera: Conjunctivae normal.     Pupils: Pupils are equal, round, and reactive to light.  Cardiovascular:     Rate and Rhythm: Normal rate and regular rhythm.     Pulses: Normal pulses.     Heart sounds: Normal heart sounds.  Pulmonary:     Effort: Pulmonary effort is normal.     Breath sounds: Normal breath sounds.  Abdominal:     General: Bowel sounds are normal.     Palpations: Abdomen is soft.  Musculoskeletal:        General: No deformity or signs of injury.     Cervical back: Normal range of motion. No tenderness.     Comments: Scarring bilateral lower extremity  Skin:    General: Skin is warm.     Findings: No bruising.  Neurological:     General: No focal deficit present.     Mental Status: He is alert and oriented to person, place, and time. Mental status is at baseline.   Psychiatric:        Mood and Affect: Mood normal.        Behavior: Behavior normal.        Thought Content: Thought content normal.        Judgment: Judgment normal.  Assessment & Plan:  Chronic pain of both knees Assessment & Plan: Bilateral chronic knee pain. Pain particularly after long work shifts.  Has had a previous x-ray showed bone-on-bone few years ago.  Would like referral to Ortho. - Order knee x-rays to assess current status of osteoarthritis. - Referral sent to [facility] for orthopedic evaluation.  Orders: -     DG Knee 1-2 Views Left; Future -     DG Knee 1-2 Views Right; Future -     Ambulatory referral to Orthopedic Surgery  Prediabetes Assessment & Plan: Will check HgA1c.  Orders: -     CBC with Differential/Platelet; Future -     Comprehensive metabolic panel with GFR; Future -     Hemoglobin A1c; Future -     TSH; Future -     Lipid panel; Future  Encounter for screening for HIV -     HIV Antibody (routine testing w rflx); Future  Need for hepatitis C screening test -     Hepatitis C antibody; Future  Gastroesophageal reflux disease, unspecified whether esophagitis present Assessment & Plan: Infrequent heartburn managed with Tums as needed.   Need for tetanus, diphtheria, and acellular pertussis (Tdap) vaccine -     Tdap vaccine greater than or equal to 7yo IM  Diverticulitis Assessment & Plan: Diverticulitis diagnosed in 2022 with last flare-up two years ago. Symptoms exacerbated by seeds and nuts. - Advise avoidance of seeds and nuts to prevent flare-ups. - Encourage increased fiber intake to aid in symptom management.     No follow-ups on file.   Shuntel Fishburn, NP

## 2023-12-30 NOTE — Progress Notes (Signed)
 Established Patient Office Visit  Subjective:  Patient ID: Marcus Little, male    DOB: 1980-10-22  Age: 43 y.o. MRN: [id number]  CC:  Chief Complaint  Patient presents with   Establish Care   Annual Exam   Discussed the use of a AI scribe software for clinical note transcription with the patient, who gave verbal consent to proceed.  HPI  Marcus Little presents for:  HPI   Past Medical History:  Diagnosis Date   Depression    Diverticulitis 01/11/2018   Inpt    Prediabetes    Pyelonephritis     Past Surgical History:  Procedure Laterality Date   COLONOSCOPY WITH PROPOFOL  N/A 03/24/2018   Procedure: COLONOSCOPY WITH PROPOFOL ;  Surgeon: Toledo, Ladell POUR, MD;  Location: [facility];  Service: Gastroenterology;  Laterality: N/A;    History reviewed. No pertinent family history.  Social History   Socioeconomic History   Marital status: Single    Spouse name: emily   Number of children: 1   Years of education: Not on file   Highest education level: Not on file  Occupational History   Not on file  Tobacco Use   Smoking status: Former    Types: Cigars   Smokeless tobacco: Never  Vaping Use   Vaping status: Every Day   Substances: Nicotine, Flavoring  Substance and Sexual Activity   Alcohol use: Never   Drug use: Not Currently    Types: Marijuana    Comment: Sat last use 03/21/18   Sexual activity: Not on file  Other Topics Concern   Not on file  Social History Narrative   Not on file   Social Drivers of Health   Financial Resource Strain: Not on file  Food Insecurity: Not on file  Transportation Needs: Not on file  Physical Activity: Not on file  Stress: Not on file  Social Connections: Not on file  Intimate Partner Violence: Not on file     Outpatient Medications Prior to Visit  Medication Sig Dispense Refill   HYDROcodone -acetaminophen  (NORCO) 5-325 MG tablet Take 1 tablet by mouth every 6 (six) hours as needed for up to 6  doses for moderate pain. 6 tablet 0   ibuprofen  (ADVIL ) 800 MG tablet Take 1 tablet (800 mg total) by mouth every 8 (eight) hours as needed for mild pain or moderate pain. 30 tablet 0   No facility-administered medications prior to visit.    Allergies  Allergen Reactions   Other Swelling    Calvin klein cologne    ROS Review of Systems Negative unless indicated in HPI.    Objective:    Physical Exam  BP 116/78   Pulse 61   Temp 98.1 F (36.7 C)   Ht 5' 8 (1.727 m)   Wt 216 lb 9.6 oz (98.2 kg)   SpO2 98%   BMI 32.93 kg/m  Wt Readings from Last 3 Encounters:  12/30/23 216 lb 9.6 oz (98.2 kg)  11/21/22 242 lb (109.8 kg)  03/02/19 (!) 330 lb 11 oz (150 kg)     Health Maintenance  Topic Date Due   Hepatitis C Screening  Never done   DTaP/Tdap/Td (4 - Tdap) 06/04/1997   Hepatitis B Vaccines 19-59 Average Risk (1 of 3 - 19+ 3-dose series) Never done   HPV VACCINES (1 - 3-dose SCDM series) Never done   COVID-19 Vaccine (3 - 2024-25 season) 01/14/2024 (Originally 01/12/2023)   INFLUENZA VACCINE  08/10/2024 (Originally 12/12/2023)   Colonoscopy  03/24/2028  HIV Screening  Completed   Pneumococcal Vaccine  Aged Out   Meningococcal B Vaccine  Aged Out       Topic Date Due   Hepatitis B Vaccines 19-59 Average Risk (1 of 3 - 19+ 3-dose series) Never done   HPV VACCINES (1 - 3-dose SCDM series) Never done    No results found for: TSH Lab Results  Component Value Date   WBC 12.0 (H) 03/02/2019   HGB 15.3 03/02/2019   HCT 48.1 03/02/2019   MCV 88.7 03/02/2019   PLT 335 03/02/2019   Lab Results  Component Value Date   NA 139 03/02/2019   K 3.9 03/02/2019   CO2 25 03/02/2019   GLUCOSE 100 (H) 03/02/2019   BUN 14 03/02/2019   CREATININE 0.75 03/02/2019   BILITOT 1.2 01/11/2018   ALKPHOS 79 01/11/2018   AST 27 01/11/2018   ALT 24 01/11/2018   PROT 7.5 01/11/2018   ALBUMIN 3.6 01/11/2018   CALCIUM 9.5 03/02/2019   ANIONGAP 12 03/02/2019   No results found  for: CHOL No results found for: HDL No results found for: LDLCALC No results found for: TRIG No results found for: CHOLHDL No results found for: YHAJ8R    Assessment & Plan:  There are no diagnoses linked to this encounter.  Follow-up: No follow-ups on file.   Romelo Sciandra, NP

## 2023-12-30 NOTE — Patient Instructions (Signed)
 Please go to the lab for Xrays.Will call back with the results. Schedule fasting lab during check out.

## 2024-01-02 ENCOUNTER — Other Ambulatory Visit

## 2024-01-06 ENCOUNTER — Ambulatory Visit: Payer: Self-pay | Admitting: Nurse Practitioner

## 2024-01-06 DIAGNOSIS — G8929 Other chronic pain: Secondary | ICD-10-CM | POA: Insufficient documentation

## 2024-01-06 DIAGNOSIS — R7303 Prediabetes: Secondary | ICD-10-CM | POA: Insufficient documentation

## 2024-01-06 DIAGNOSIS — K219 Gastro-esophageal reflux disease without esophagitis: Secondary | ICD-10-CM | POA: Insufficient documentation

## 2024-01-06 NOTE — Assessment & Plan Note (Signed)
 Infrequent heartburn managed with Tums as needed.

## 2024-01-06 NOTE — Assessment & Plan Note (Signed)
 Diverticulitis diagnosed in 2022 with last flare-up two years ago. Symptoms exacerbated by seeds and nuts. - Advise avoidance of seeds and nuts to prevent flare-ups. - Encourage increased fiber intake to aid in symptom management.

## 2024-01-06 NOTE — Assessment & Plan Note (Signed)
 Will check HgA1c

## 2024-01-06 NOTE — Assessment & Plan Note (Signed)
 Bilateral chronic knee pain. Pain particularly after long work shifts.  Has had a previous x-ray showed bone-on-bone few years ago.  Would like referral to Ortho. - Order knee x-rays to assess current status of osteoarthritis. - Referral sent to [facility] for orthopedic evaluation.

## 2024-01-08 ENCOUNTER — Telehealth: Payer: Self-pay

## 2024-01-08 ENCOUNTER — Other Ambulatory Visit (INDEPENDENT_AMBULATORY_CARE_PROVIDER_SITE_OTHER)

## 2024-01-08 ENCOUNTER — Other Ambulatory Visit: Payer: Self-pay | Admitting: Nurse Practitioner

## 2024-01-08 DIAGNOSIS — R7989 Other specified abnormal findings of blood chemistry: Secondary | ICD-10-CM

## 2024-01-08 DIAGNOSIS — Z1159 Encounter for screening for other viral diseases: Secondary | ICD-10-CM

## 2024-01-08 DIAGNOSIS — R7303 Prediabetes: Secondary | ICD-10-CM | POA: Diagnosis not present

## 2024-01-08 DIAGNOSIS — Z114 Encounter for screening for human immunodeficiency virus [HIV]: Secondary | ICD-10-CM

## 2024-01-08 LAB — COMPREHENSIVE METABOLIC PANEL WITH GFR
ALT: 13 U/L (ref 0–53)
AST: 13 U/L (ref 0–37)
Albumin: 4.3 g/dL (ref 3.5–5.2)
Alkaline Phosphatase: 54 U/L (ref 39–117)
BUN: 12 mg/dL (ref 6–23)
CO2: 32 meq/L (ref 19–32)
Calcium: 9.2 mg/dL (ref 8.4–10.5)
Chloride: 100 meq/L (ref 96–112)
Creatinine, Ser: 0.91 mg/dL (ref 0.40–1.50)
GFR: 101.73 mL/min (ref >60.00)
Glucose, Bld: 94 mg/dL (ref 70–99)
Potassium: 4.4 meq/L (ref 3.5–5.1)
Sodium: 139 meq/L (ref 135–145)
Total Bilirubin: 0.8 mg/dL (ref 0.2–1.2)
Total Protein: 7 g/dL (ref 6.0–8.3)

## 2024-01-08 LAB — CBC WITH DIFFERENTIAL/PLATELET
Basophils Absolute: 0 K/uL (ref 0.0–0.1)
Basophils Relative: 0.8 % (ref 0.0–3.0)
Eosinophils Absolute: 0.2 K/uL (ref 0.0–0.7)
Eosinophils Relative: 3.1 % (ref 0.0–5.0)
HCT: 44.1 % (ref 39.0–52.0)
Hemoglobin: 14.5 g/dL (ref 13.0–17.0)
Lymphocytes Relative: 31.7 % (ref 12.0–46.0)
Lymphs Abs: 1.8 K/uL (ref 0.7–4.0)
MCHC: 33 g/dL (ref 30.0–36.0)
MCV: 85.7 fl (ref 78.0–100.0)
Monocytes Absolute: 0.4 K/uL (ref 0.1–1.0)
Monocytes Relative: 6.6 % (ref 3.0–12.0)
Neutro Abs: 3.2 K/uL (ref 1.4–7.7)
Neutrophils Relative %: 57.8 % (ref 43.0–77.0)
Platelets: 271 K/uL (ref 150.0–400.0)
RBC: 5.14 Mil/uL (ref 4.22–5.81)
RDW: 13.8 % (ref 11.5–15.5)
WBC: 5.6 K/uL (ref 4.0–10.5)

## 2024-01-08 LAB — LIPID PANEL
Cholesterol: 190 mg/dL (ref 0–200)
HDL: 56.4 mg/dL (ref >39.00)
LDL Cholesterol: 120 mg/dL — ABNORMAL HIGH (ref 0–99)
NonHDL: 133.62
Total CHOL/HDL Ratio: 3
Triglycerides: 66 mg/dL (ref 0.0–149.0)
VLDL: 13.2 mg/dL (ref 0.0–40.0)

## 2024-01-08 LAB — TSH: TSH: 0.32 u[IU]/mL — ABNORMAL LOW (ref 0.35–5.50)

## 2024-01-08 LAB — HEMOGLOBIN A1C: Hgb A1c MFr Bld: 5.5 % (ref 4.6–6.5)

## 2024-01-08 NOTE — Telephone Encounter (Signed)
 Thank you :)

## 2024-01-08 NOTE — Telephone Encounter (Signed)
 Faxed add on labs to [facility] and received an okay confirmation.

## 2024-01-09 ENCOUNTER — Ambulatory Visit (INDEPENDENT_AMBULATORY_CARE_PROVIDER_SITE_OTHER)

## 2024-01-09 DIAGNOSIS — R7989 Other specified abnormal findings of blood chemistry: Secondary | ICD-10-CM

## 2024-01-09 LAB — T4, FREE: Free T4: 0.95 ng/dL (ref 0.60–1.60)

## 2024-01-09 LAB — T3, FREE: T3, Free: 3.6 pg/mL (ref 2.3–4.2)

## 2024-01-09 LAB — HIV ANTIBODY (ROUTINE TESTING W REFLEX): HIV 1&2 Ab, 4th Generation: NONREACTIVE

## 2024-01-09 LAB — HEPATITIS C ANTIBODY: Hepatitis C Ab: NONREACTIVE

## 2024-01-15 ENCOUNTER — Ambulatory Visit: Payer: Self-pay | Admitting: Nurse Practitioner

## 2024-01-15 NOTE — Progress Notes (Signed)
 The blood work shows no sign of anemia, electrolytes normal. Liver, kidney normal, LDL elevated: Manage diet and exercise. TSH low but free t3 and free t4 normal.

## 2024-03-02 NOTE — Progress Notes (Addendum)
 [facility] [address] [place] [address] Phone: [phone] Subjective:   Marcus Little, am serving as a scribe for Dr. Arthea Claudene.  I'Marcus seeing this patient by the request  of:  Marcus Saber, NP  CC: bilateral knee pain   YEP:Dlagzrupcz  Marcus Little is a 43 y.o. male coming in with complaint of B knee pain. Patient states that his R knee is worse today than his L but his L typically bothers him more. Steroid injections are typically helpful. Wants to have replacement in February.   Xray R knee 12/30/2023 IMPRESSION: Moderate to severe osteoarthritis of the knee, involving the medial and patellofemoral compartments.    Xray L knee 12/30/2023 IMPRESSION: Severe osteoarthritis of the knee involving the medial and patellofemoral compartments with bone-on-bone articulation.    Past Medical History:  Diagnosis Date   Arthritis    Depression    Diabetes mellitus without complication (HCC)    Diverticulitis 01/11/2018   Inpt    GERD (gastroesophageal reflux disease)    Prediabetes    Pyelonephritis    Seizures (HCC)    Past Surgical History:  Procedure Laterality Date   COLONOSCOPY WITH PROPOFOL  N/A 03/24/2018   Procedure: COLONOSCOPY WITH PROPOFOL ;  Surgeon: Toledo, Ladell POUR, MD;  Location: [facility];  Service: Gastroenterology;  Laterality: N/A;   Hernia mesh repair surgery  2024   Social History   Socioeconomic History   Marital status: Single    Spouse name: Marcus Little   Number of children: 1   Years of education: Not on file   Highest education level: Not on file  Occupational History   Not on file  Tobacco Use   Smoking status: Former    Types: Cigars   Smokeless tobacco: Never  Vaping Use   Vaping status: Every Day   Substances: Nicotine, Flavoring  Substance and Sexual Activity   Alcohol use: Not Currently   Drug use: Not Currently    Types: Marijuana    Comment: Sat last use 03/21/18   Sexual  activity: Not on file  Other Topics Concern   Not on file  Social History Narrative   Not on file   Social Drivers of Health   Financial Resource Strain: Not on file  Food Insecurity: Not on file  Transportation Needs: Not on file  Physical Activity: Not on file  Stress: Not on file  Social Connections: Not on file   Allergies  Allergen Reactions   Other Swelling    Marcus Little   Family History  Problem Relation Age of Onset   Drug abuse Mother    Depression Mother    COPD Mother    Arthritis Mother    Cancer Mother        spleen   Kidney disease Father    Arthritis Father    Alcohol abuse Father    Cancer Maternal Grandmother        breast cancer       Current Outpatient Medications (Analgesics):    HYDROcodone -acetaminophen  (NORCO) 5-325 MG tablet, Take 1 tablet by mouth every 6 (six) hours as needed for up to 6 doses for moderate pain.   ibuprofen  (ADVIL ) 800 MG tablet, Take 1 tablet (800 mg total) by mouth every 8 (eight) hours as needed for mild pain or moderate pain.     Reviewed prior external information including notes and imaging from  primary care provider As well as notes that were available from care everywhere  and other healthcare systems.  Past medical history, social, surgical and family history all reviewed in electronic medical record.  No pertanent information unless stated regarding to the chief complaint.   Review of Systems:  No headache, visual changes, nausea, vomiting, diarrhea, constipation, dizziness, abdominal pain, skin rash, fevers, chills, night sweats, weight loss, swollen lymph nodes, body aches, chest pain, shortness of breath, mood changes. POSITIVE muscle aches, joint swelling  Objective  Blood pressure 110/70, pulse 74, height 5' 8 (1.727 Marcus), weight 218 lb (98.9 kg), SpO2 98%.   General: No apparent distress alert and oriented x3 mood and affect normal, dressed appropriately.  HEENT: Pupils equal, extraocular  movements intact  Respiratory: Patient's speak in full sentences and does not appear short of breath  Hemosiderin deposits of the lower extremity.  Severe varus deformity of the knees bilaterally.  Severe instability on the left knee with crepitus noted.  Right knee does have trace effusion noted and limited range of motion.  Abnormal thigh to calf ratio noted.  After informed written and verbal consent, patient was seated on exam table. Right knee was prepped with alcohol swab and utilizing anterolateral approach, patient's right knee space was injected with 4:1  marcaine  0.5%: depomedrol 40mg /dL. Patient tolerated the procedure well without immediate complications.  After informed written and verbal consent, patient was seated on exam table. Left knee was prepped with alcohol swab and utilizing anterolateral approach, patient's left knee space was injected with 4:1  marcaine  0.5%: depomedrol 40mg /dL. Patient tolerated the procedure well without immediate complications.    Impression and Recommendations:     The above documentation has been reviewed and is accurate and complete Marcus Little Marcus Lillyan Hitson, DO

## 2024-03-04 ENCOUNTER — Ambulatory Visit: Payer: Self-pay

## 2024-03-04 ENCOUNTER — Telehealth: Payer: Self-pay

## 2024-03-04 ENCOUNTER — Encounter: Payer: Self-pay | Admitting: Family Medicine

## 2024-03-04 ENCOUNTER — Ambulatory Visit: Admitting: Family Medicine

## 2024-03-04 VITALS — BP 110/70 | HR 74 | Ht 68.0 in | Wt 218.0 lb

## 2024-03-04 DIAGNOSIS — M25562 Pain in left knee: Secondary | ICD-10-CM | POA: Diagnosis not present

## 2024-03-04 DIAGNOSIS — M1712 Unilateral primary osteoarthritis, left knee: Secondary | ICD-10-CM | POA: Insufficient documentation

## 2024-03-04 DIAGNOSIS — M170 Bilateral primary osteoarthritis of knee: Secondary | ICD-10-CM | POA: Diagnosis not present

## 2024-03-04 DIAGNOSIS — G8929 Other chronic pain: Secondary | ICD-10-CM | POA: Diagnosis not present

## 2024-03-04 DIAGNOSIS — M25561 Pain in right knee: Secondary | ICD-10-CM

## 2024-03-04 NOTE — Telephone Encounter (Signed)
 Patient ran for Monovisc for bilateral knees on 03/04/24. Case [id number]. Pending approval.

## 2024-03-04 NOTE — Telephone Encounter (Signed)
-----   Message from Berwyn Posey sent at 03/04/2024 11:58 AM EDT ----- Regarding: visco Can you please run patient for visco for both knees?  Thank you

## 2024-03-04 NOTE — Patient Instructions (Addendum)
 Injected both knees today Dr. Vernetta Ryan-L medial unloader See me in 2 months for gel

## 2024-03-04 NOTE — Assessment & Plan Note (Addendum)
 Severe bilateral knee bone-on-bone osteoarthritic changes of the medial compartment but severely worse on the left side with severe instability noted.  Patient is looking forward to potential replacement on the left knee in the relatively near future.  I do believe though that we do need to have a medial unloader brace given.  Patient is ambulating and has an abnormal thigh to calf ratio so a custom brace is necessary.  I do think it would be significantly more beneficial.  Discussed which activities to do and which ones to avoid.  Increase activity slowly.  Follow-up again 6 to 12 weeks could be a candidate for viscosupplementation of the right knee.  Will refer patient to orthopedic surgeon to discuss the possibility of surgical intervention early next year.

## 2024-03-08 NOTE — Telephone Encounter (Signed)
 Preferred medication is gelsyn durolane or euflexa. Changed medication to Durolane.  Ran new benefits for durolane and faxed precertification forma to [employer].  Case ID on [id number] is [id number]

## 2024-03-26 NOTE — Telephone Encounter (Signed)
 Noted

## 2024-03-26 NOTE — Telephone Encounter (Signed)
 Patient scheduled for 05/04/24  Durolane authorized for bilateral knee PRE CERT REQUIRED Copay $30 Deductible does not apply OOP MAX $4500 has met $53.43 Once OOP has been met copay will no longer apply Medical notes may be required at the time of claims processing  AUTHORIZATION # [id number] 03/08/24-09/06/24

## 2024-04-05 ENCOUNTER — Encounter: Payer: Self-pay | Admitting: Orthopaedic Surgery

## 2024-04-05 ENCOUNTER — Ambulatory Visit: Admitting: Orthopaedic Surgery

## 2024-04-05 VITALS — Ht 68.0 in | Wt 218.0 lb

## 2024-04-05 DIAGNOSIS — M1711 Unilateral primary osteoarthritis, right knee: Secondary | ICD-10-CM | POA: Insufficient documentation

## 2024-04-05 DIAGNOSIS — M1712 Unilateral primary osteoarthritis, left knee: Secondary | ICD-10-CM

## 2024-04-05 DIAGNOSIS — M170 Bilateral primary osteoarthritis of knee: Secondary | ICD-10-CM | POA: Diagnosis not present

## 2024-04-05 NOTE — Progress Notes (Signed)
 The patient is a very pleasant 43 year old gentleman sent to me from Dr. Arthea Sharps, sports medicine physician, for evaluation and treatment of severe end-stage arthritis of the patient's knees.  The the left knee is much worse than the right knee.  He has had steroid injections over the years which have been somewhat helpful.  At this point his bilateral knee pain is daily and it is detrimentally affecting his mobility, his quality of life and his actives daily living.  He has never had surgery on his knees.  He states that the severe malalignment of his knees and the bone-on-bone wear came from morbid obesity for many years.  He said at his heaviest he was over 40 pounds.  He is down to 218 pounds with a BMI of 33.15.  He is interested in a replacement surgery.  He has also had hyaluronic acid injections for his knees and has been through physical therapy and taking anti-inflammatories.  Examination of both knees shows significant and severe varus malalignment with the left worse than the right.  There is significant medial joint line tenderness and patellofemoral crepitation with limited range of motion of both knees.  There is venous stasis changes in both legs.  Both feet are well-perfused.  X-rays of both knees were reviewed and he has severe end-stage bone-on-bone arthritis of both knees.  The left knee has severe varus malalignment with likely gross instability of the knee.  There are osteophytes in all 3 compartments.  We had a long and thorough discussion about knee replacement surgery.  He knows that this can be a difficult undertaking given the severe malalignment of his left knee.  We talked about the risks and benefits of surgery and what to expect from an intraoperative and postoperative standpoint.  I went over his x-rays with him in a knee replacement model.  All question concerns were answered and addressed.  Will work on getting him scheduled for knee replacement sometime in the near  future.  He is welcome to reach out to us  anytime for other questions or concerns.

## 2024-04-19 ENCOUNTER — Telehealth: Payer: Self-pay | Admitting: Family Medicine

## 2024-04-19 NOTE — Telephone Encounter (Signed)
 The patient is scheduled for knee surgery on 05/04/24. He will not see the surgeon again until right before. He asked if Dr Claudene would be willing to fill out a handicap placard for him until he has surgery? He is having a really hard time.

## 2024-04-20 NOTE — Telephone Encounter (Signed)
 Patient aware. Form given.

## 2024-04-21 ENCOUNTER — Other Ambulatory Visit: Payer: Self-pay | Admitting: Physician Assistant

## 2024-04-21 DIAGNOSIS — Z01818 Encounter for other preprocedural examination: Secondary | ICD-10-CM

## 2024-04-27 NOTE — Progress Notes (Signed)
 Surgical Instructions   Your procedure is scheduled on DECEMBER 23,2025. Report to [facility] Main Entrance A at 9:30 A.M., then check in with the Admitting office. Any questions or running late day of surgery: call [phone]  Questions prior to your surgery date: call [phone], Monday-Friday, 8am-4pm. If you experience any cold or flu symptoms such as cough, fever, chills, shortness of breath, etc. between now and your scheduled surgery, please notify us  at the above number.     Remember:  Do not eat after midnight the night before your surgery  You may drink clear liquids until 8:30 the morning of your surgery.   Clear liquids allowed are: Water, Non-Citrus Juices (without pulp), Carbonated Beverages, Clear Tea (no milk, honey, etc.), Black Coffee Only (NO MILK, CREAM OR POWDERED CREAMER of any kind), and Gatorade. Patient Instructions  The night before surgery:  No food after midnight. ONLY clear liquids after midnight  The day of surgery (if you do NOT have diabetes):  Drink ONE (1) Pre-Surgery Clear Ensure by 8:30 the morning of surgery. Drink in one sitting. Do not sip.  This drink was given to you during your hospital  pre-op appointment visit.  Nothing else to drink after completing the  Pre-Surgery Clear Ensure.          If you have questions, please contact your surgeons office.    Take these medicines the morning of surgery with A SIP OF WATER  acetaminophen  (TYLENOL )   May take these medicines IF NEEDED: CLEAR EYES FOR DRY EYES PLUS OP    One week prior to surgery, STOP taking any Aspirin (unless otherwise instructed by your surgeon) Aleve, Naproxen, Ibuprofen , Motrin , Advil , Goody's, BC's, all herbal medications, fish oil, and non-prescription vitamins.                     Do NOT Smoke (Tobacco/Vaping) for 24 hours prior to your procedure.  If you use a CPAP at night, you may bring your mask/headgear for your overnight stay.   You will be asked to  remove any contacts, glasses, piercing's, hearing aid's, dentures/partials prior to surgery. Please bring cases for these items if needed.    Patients discharged the day of surgery will not be allowed to drive home, and someone needs to stay with them for 24 hours.  SURGICAL WAITING ROOM VISITATION Patients may have no more than 2 support people in the waiting area - these visitors may rotate.   Pre-op nurse will coordinate an appropriate time for 1 ADULT support person, who may not rotate, to accompany patient in pre-op.  Children under the age of 63 must have an adult with them who is not the patient and must remain in the main waiting area with an adult.  If the patient needs to stay at the hospital during part of their recovery, the visitor guidelines for inpatient rooms apply.  Please refer to the [facility] website for the visitor guidelines for any additional information.   If you received a COVID test during your pre-op visit  it is requested that you wear a mask when out in public, stay away from anyone that may not be feeling well and notify your surgeon if you develop symptoms. If you have been in contact with anyone that has tested positive in the last 10 days please notify you surgeon.      Pre-operative 4 CHG Bathing Instructions   You can play a key role in reducing the risk of  infection after surgery. Your skin needs to be as free of germs as possible. You can reduce the number of germs on your skin by washing with CHG (chlorhexidine  gluconate) soap before surgery. CHG is an antiseptic soap that kills germs and continues to kill germs even after washing.   DO NOT use if you have an allergy to chlorhexidine /CHG or antibacterial soaps. If your skin becomes reddened or irritated, stop using the CHG and notify one of our RNs at [phone].   Please shower with the CHG soap starting 4 days before surgery using the following schedule:     Please keep in mind the following:   DO NOT shave, including legs and underarms, starting the day of your first shower.   You may shave your face at any point before/day of surgery.  Place clean sheets on your bed the day you start using CHG soap. Use a clean washcloth (not used since being washed) for each shower. DO NOT sleep with pets once you start using the CHG.   CHG Shower Instructions:  Wash your face and private area with normal soap. If you choose to wash your hair, wash first with your normal shampoo.  After you use shampoo/soap, rinse your hair and body thoroughly to remove shampoo/soap residue.  Turn the water OFF and apply  bottle of CHG soap to a CLEAN washcloth.  Apply CHG soap ONLY FROM YOUR NECK DOWN TO YOUR TOES (washing for 3-5 minutes)  DO NOT use CHG soap on face, private areas, open wounds, or sores.  Pay special attention to the area where your surgery is being performed.  If you are having back surgery, having someone wash your back for you may be helpful. Wait 2 minutes after CHG soap is applied, then you may rinse off the CHG soap.  Pat dry with a clean towel  Put on clean clothes/pajamas   If you choose to wear lotion, please use ONLY the CHG-compatible lotions that are listed below.  Additional instructions for the day of surgery:  If you choose, you may shower the morning of surgery with an antibacterial soap.  DO NOT APPLY any lotions, deodorants, cologne, or perfumes.   Do not bring valuables to the hospital. [facility] is not responsible for any belongings/valuables. Do not wear nail polish, gel polish, artificial nails, or any other type of covering on natural nails (fingers and toes) Do not wear jewelry or makeup Put on clean/comfortable clothes.  Please brush your teeth.  Ask your nurse before applying any prescription medications to the skin.     CHG Compatible Lotions   Aveeno Moisturizing lotion  Cetaphil Moisturizing Cream  Cetaphil Moisturizing Lotion  Clairol Herbal  Essence Moisturizing Lotion, Dry Skin  Clairol Herbal Essence Moisturizing Lotion, Extra Dry Skin  Clairol Herbal Essence Moisturizing Lotion, Normal Skin  Curel Age Defying Therapeutic Moisturizing Lotion with Alpha Hydroxy  Curel Extreme Care Body Lotion  Curel Soothing Hands Moisturizing Hand Lotion  Curel Therapeutic Moisturizing Cream, Fragrance-Free  Curel Therapeutic Moisturizing Lotion, Fragrance-Free  Curel Therapeutic Moisturizing Lotion, Original Formula  Eucerin Daily Replenishing Lotion  Eucerin Dry Skin Therapy Plus Alpha Hydroxy Crme  Eucerin Dry Skin Therapy Plus Alpha Hydroxy Lotion  Eucerin Original Crme  Eucerin Original Lotion  Eucerin Plus Crme Eucerin Plus Lotion  Eucerin TriLipid Replenishing Lotion  Keri Anti-Bacterial Hand Lotion  Keri Deep Conditioning Original Lotion Dry Skin Formula Softly Scented  Keri Deep Conditioning Original Lotion, Fragrance Free Sensitive Skin Formula  Keri Lotion  Fast Absorbing Fragrance Free Sensitive Skin Formula  Keri Lotion Fast Absorbing Softly Scented Dry Skin Formula  Keri Original Lotion  Keri Skin Renewal Lotion Keri Silky Smooth Lotion  Keri Silky Smooth Sensitive Skin Lotion  Nivea Body Creamy Conditioning Oil  Nivea Body Extra Enriched Lotion  Nivea Body Original Lotion  Nivea Body Sheer Moisturizing Lotion Nivea Crme  Nivea Skin Firming Lotion  NutraDerm 30 Skin Lotion  NutraDerm Skin Lotion  NutraDerm Therapeutic Skin Cream  NutraDerm Therapeutic Skin Lotion  ProShield Protective Hand Cream  Provon moisturizing lotion  Please read over the following fact sheets that you were given.

## 2024-04-28 ENCOUNTER — Inpatient Hospital Stay (HOSPITAL_COMMUNITY): Admission: RE | Admit: 2024-04-28 | Discharge: 2024-04-28 | Attending: Orthopaedic Surgery

## 2024-04-28 ENCOUNTER — Other Ambulatory Visit: Payer: Self-pay

## 2024-04-28 ENCOUNTER — Encounter (HOSPITAL_COMMUNITY): Payer: Self-pay

## 2024-04-28 DIAGNOSIS — Z01818 Encounter for other preprocedural examination: Secondary | ICD-10-CM

## 2024-04-28 DIAGNOSIS — Z01812 Encounter for preprocedural laboratory examination: Secondary | ICD-10-CM | POA: Diagnosis present

## 2024-04-28 HISTORY — DX: Prediabetes: R73.03

## 2024-04-28 LAB — TYPE AND SCREEN
ABO/RH(D): A POS
Antibody Screen: NEGATIVE

## 2024-04-28 LAB — CBC
HCT: 43.5 % (ref 39.0–52.0)
Hemoglobin: 14.2 g/dL (ref 13.0–17.0)
MCH: 28.8 pg (ref 26.0–34.0)
MCHC: 32.6 g/dL (ref 30.0–36.0)
MCV: 88.2 fL (ref 80.0–100.0)
Platelets: 297 K/uL (ref 150–400)
RBC: 4.93 MIL/uL (ref 4.22–5.81)
RDW: 13.3 % (ref 11.5–15.5)
WBC: 8.7 K/uL (ref 4.0–10.5)
nRBC: 0 % (ref 0.0–0.2)

## 2024-04-28 LAB — BASIC METABOLIC PANEL WITH GFR
Anion gap: 7 (ref 5–15)
BUN: 13 mg/dL (ref 6–20)
CO2: 29 mmol/L (ref 22–32)
Calcium: 9.1 mg/dL (ref 8.9–10.3)
Chloride: 102 mmol/L (ref 98–111)
Creatinine, Ser: 0.78 mg/dL (ref 0.61–1.24)
GFR, Estimated: >60 mL/min (ref >60)
Glucose, Bld: 91 mg/dL (ref 70–99)
Potassium: 4.2 mmol/L (ref 3.5–5.1)
Sodium: 138 mmol/L (ref 135–145)

## 2024-04-28 LAB — SURGICAL PCR SCREEN
MRSA, PCR: NEGATIVE
Staphylococcus aureus: POSITIVE — AB

## 2024-04-28 NOTE — Progress Notes (Signed)
 PCP - Champreet Kaur,NP Cardiologist - denies  PPM/ICD - denies Device Orders -  Rep Notified -   Chest x-ray - na EKG - na Stress Test - denies ECHO - denies Cardiac Cath - denies  Sleep Study - denies CPAP - no  Fasting Blood Sugar - na Checks Blood Sugar _____ times a day  Last dose of GLP1 agonist-  na GLP1 instructions: na  Blood Thinner Instructions:na Aspirin Instructions:na  ERAS Protcol -clear liquids until 0830 PRE-SURGERY Ensure or G2- Ensure  COVID TEST- na   Anesthesia review: no  Patient denies shortness of breath, fever, cough and chest pain at PAT appointment   All instructions explained to the patient, with a verbal understanding of the material. Patient agrees to go over the instructions while at home for a better understanding. The opportunity to ask questions was provided.

## 2024-04-29 ENCOUNTER — Encounter (HOSPITAL_COMMUNITY): Payer: Self-pay | Admitting: Certified Registered"

## 2024-05-03 ENCOUNTER — Telehealth: Payer: Self-pay

## 2024-05-03 NOTE — Telephone Encounter (Signed)
 Call from patient to cancel surgery for tomorrow.  He stated he has things going on and is not ready.  Will call back to reschedule.

## 2024-05-04 ENCOUNTER — Ambulatory Visit (HOSPITAL_COMMUNITY): Admission: RE | Admit: 2024-05-04 | Admitting: Orthopaedic Surgery

## 2024-05-04 ENCOUNTER — Encounter: Admission: RE | Payer: Self-pay

## 2024-05-04 ENCOUNTER — Ambulatory Visit: Admitting: Family Medicine

## 2024-05-04 SURGERY — ARTHROPLASTY, KNEE, TOTAL
Anesthesia: Spinal | Site: Knee | Laterality: Left

## 2024-05-17 ENCOUNTER — Encounter: Admitting: Orthopaedic Surgery

## 2024-05-22 ENCOUNTER — Emergency Department: Payer: Self-pay

## 2024-05-22 ENCOUNTER — Other Ambulatory Visit: Payer: Self-pay

## 2024-05-22 ENCOUNTER — Emergency Department
Admission: EM | Admit: 2024-05-22 | Discharge: 2024-05-22 | Disposition: A | Discharge status: Home / Self Care | Payer: Self-pay

## 2024-05-22 DIAGNOSIS — R569 Unspecified convulsions: Secondary | ICD-10-CM | POA: Insufficient documentation

## 2024-05-22 DIAGNOSIS — S0001XA Abrasion of scalp, initial encounter: Secondary | ICD-10-CM | POA: Insufficient documentation

## 2024-05-22 DIAGNOSIS — X58XXXA Exposure to other specified factors, initial encounter: Secondary | ICD-10-CM | POA: Insufficient documentation

## 2024-05-22 LAB — URINALYSIS, ROUTINE W REFLEX MICROSCOPIC
Bacteria, UA: NONE SEEN
Bilirubin Urine: NEGATIVE
Glucose, UA: NEGATIVE mg/dL
Ketones, ur: 5 mg/dL — AB
Leukocytes,Ua: NEGATIVE
Nitrite: NEGATIVE
Protein, ur: 100 mg/dL — AB
Specific Gravity, Urine: 1.024 (ref 1.005–1.030)
pH: 5 (ref 5.0–8.0)

## 2024-05-22 LAB — CBC
HCT: 51 % (ref 39.0–52.0)
Hemoglobin: 16.8 g/dL (ref 13.0–17.0)
MCH: 29 pg (ref 26.0–34.0)
MCHC: 32.9 g/dL (ref 30.0–36.0)
MCV: 87.9 fL (ref 80.0–100.0)
Platelets: 388 K/uL (ref 150–400)
RBC: 5.8 MIL/uL (ref 4.22–5.81)
RDW: 13.3 % (ref 11.5–15.5)
WBC: 13.7 K/uL — ABNORMAL HIGH (ref 4.0–10.5)
nRBC: 0 % (ref 0.0–0.2)

## 2024-05-22 LAB — URINE DRUG SCREEN
Amphetamines: NEGATIVE
Barbiturates: NEGATIVE
Benzodiazepines: NEGATIVE
Cocaine: NEGATIVE
Fentanyl: NEGATIVE
Methadone Scn, Ur: NEGATIVE
Opiates: NEGATIVE
Tetrahydrocannabinol: POSITIVE — AB

## 2024-05-22 LAB — COMPREHENSIVE METABOLIC PANEL WITH GFR
ALT: 16 U/L (ref 0–44)
AST: 21 U/L (ref 15–41)
Albumin: 4.7 g/dL (ref 3.5–5.0)
Alkaline Phosphatase: 81 U/L (ref 38–126)
Anion gap: 19 — ABNORMAL HIGH (ref 5–15)
BUN: 14 mg/dL (ref 6–20)
CO2: 18 mmol/L — ABNORMAL LOW (ref 22–32)
Calcium: 9.6 mg/dL (ref 8.9–10.3)
Chloride: 100 mmol/L (ref 98–111)
Creatinine, Ser: 0.92 mg/dL (ref 0.61–1.24)
GFR, Estimated: >60 mL/min
Glucose, Bld: 206 mg/dL — ABNORMAL HIGH (ref 70–99)
Potassium: 3.9 mmol/L (ref 3.5–5.1)
Sodium: 137 mmol/L (ref 135–145)
Total Bilirubin: 0.9 mg/dL (ref 0.0–1.2)
Total Protein: 8 g/dL (ref 6.5–8.1)

## 2024-05-22 LAB — CBG MONITORING, ED: Glucose-Capillary: 166 mg/dL — ABNORMAL HIGH (ref 70–99)

## 2024-05-22 LAB — ETHANOL: Alcohol, Ethyl (B): <15 mg/dL

## 2024-05-22 MED ORDER — ACETAMINOPHEN 325 MG PO TABS
650.0000 mg | ORAL_TABLET | Freq: Once | ORAL | Status: AC
  Administered 2024-05-22: 650 mg via ORAL
  Filled 2024-05-22: qty 2

## 2024-05-22 NOTE — ED Triage Notes (Signed)
 To ED [facility] from home for 1 seizure today. No other seizure hx except one time at age 44.  CBG 144, HR 93, SPO2 93%, 124/78  No meds given, no IV  Pt denies alcohol use. Pt vomited once in triage. Pt does not know who called EMS or what the possible seizure looked like. Pt is alert and oriented.   Red top sent with labs.

## 2024-05-22 NOTE — ED Provider Notes (Signed)
 "  [facility] Provider Note    Event Date/Time   First MD Initiated Contact with Patient 05/22/24 1138     (approximate)   History   Seizures   HPI  Marcus Little is a 44 y.o. male with a past medical history of diverticulitis presenting to the emergency department via EMS after a seizure this morning.  The patient's wife reports that she heard the patient calling out and then when she went into the room the patient was stiff and convulsing.  She reports that the patient bit his tongue and urinated on himself.  Patient vomited on himself in triage.  He reports that he took a melatonin for the first time last night but does not know if this is related.  He denies alcohol use.  He reports that when he was 44 years old he was in a coma for 3 months and he thinks that he had a seizure prior to that.     Physical Exam   Triage Vital Signs: ED Triage Vitals  Encounter Vitals Group     BP 05/22/24 1026 114/79     Girls Systolic BP Percentile --      Girls Diastolic BP Percentile --      Boys Systolic BP Percentile --      Boys Diastolic BP Percentile --      Pulse Rate 05/22/24 1026 69     Resp 05/22/24 1026 20     Temp 05/22/24 1031 97.7 F (36.5 C)     Temp Source 05/22/24 1031 Oral     SpO2 05/22/24 1026 95 %     Weight 05/22/24 1028 210 lb (95.3 kg)     Height 05/22/24 1028 5' 8 (1.727 m)     Head Circumference --      Peak Flow --      Pain Score 05/22/24 1027 0     Pain Loc --      Pain Education --      Exclude from Growth Chart --     Most recent vital signs: Vitals:   05/22/24 1026 05/22/24 1031  BP: 114/79   Pulse: 69   Resp: 20   Temp:  97.7 F (36.5 C)  SpO2: 95%      General: Awake, no distress.  CV:  Good peripheral perfusion.  Resp:  Normal effort.  Abd:  No distention.  Other:  1.5 cm abrasion to the left lower lip   ED Results / Procedures / Treatments   Labs (all labs ordered are listed, but only abnormal  results are displayed) Labs Reviewed  COMPREHENSIVE METABOLIC PANEL WITH GFR - Abnormal; Notable for the following components:      Result Value   CO2 18 (*)    Glucose, Bld 206 (*)    Anion gap 19 (*)    All other components within normal limits  CBC - Abnormal; Notable for the following components:   WBC 13.7 (*)    All other components within normal limits  CBG MONITORING, ED - Abnormal; Notable for the following components:   Glucose-Capillary 166 (*)    All other components within normal limits  URINALYSIS, ROUTINE W REFLEX MICROSCOPIC  URINE DRUG SCREEN     EKG ED ECG REPORT I, Reche CHRISTELLA Leventhal, the attending physician, personally viewed and interpreted this ECG.  Date: 05/22/2024 Rate: 73 bpm Rhythm: normal sinus rhythm QRS Axis: normal Intervals: normal ST/T Wave abnormalities: normal Narrative Interpretation: no evidence of acute  ischemia     RADIOLOGY Head CT does not show any acute pathology.    PROCEDURES:  Critical Care performed: No  Procedures   MEDICATIONS ORDERED IN ED: Medications - No data to display   IMPRESSION / MDM / ASSESSMENT AND PLAN / ED COURSE  I reviewed the triage vital signs and the nursing notes.                              Differential diagnosis includes, but is not limited to, intracranial hemorrhage, intracranial mass, meningitis/encephalitis, brain abscess, epilepsy, metabolic encephalopathy, electrolyte abnormality  Patient's presentation is most consistent with acute presentation with potential threat to life or bodily function.  Patient is a 44 year old male with past medical history of diverticulitis presenting to the emergency department via EMS after an episode of seizure-like activity this morning.  Lab work and head CT ordered for further evaluation.  Tylenol  ordered for the patient's headache.  Lab work is unremarkable.  UDS is positive for marijuana.  Patient's white blood cell count is elevated however  this is likely related to the seizure activity.  I discussed with the patient and his wife that based on their descriptions it does sound likely that he had a seizure this morning.  We discussed seizure precautions at home until he is able to follow-up with neurology.  I have placed a referral for an urgent neurology follow-up as well as provided them with contact information for neurologist within our system.  They will follow-up with patient's primary care physician within the next week as well.  Return to the emergency department for new or worsening symptoms.      FINAL CLINICAL IMPRESSION(S) / ED DIAGNOSES   Final diagnoses:  Seizure-like activity (HCC)     Rx / DC Orders   ED Discharge Orders          Ordered    Ambulatory referral to Neurology       Comments: An appointment is requested in approximately: 1 week   05/22/24 1251             Note:  This document was prepared using Dragon voice recognition software and may include unintentional dictation errors.   Rexford Reche HERO, MD 05/22/24 1301  "

## 2024-05-28 ENCOUNTER — Ambulatory Visit (INDEPENDENT_AMBULATORY_CARE_PROVIDER_SITE_OTHER): Payer: Self-pay | Admitting: Family Medicine

## 2024-05-28 ENCOUNTER — Encounter: Payer: Self-pay | Admitting: Family Medicine

## 2024-05-28 VITALS — BP 120/68 | HR 70 | Temp 97.8°F | Ht 68.0 in | Wt 219.0 lb

## 2024-05-28 DIAGNOSIS — Z8719 Personal history of other diseases of the digestive system: Secondary | ICD-10-CM

## 2024-05-28 DIAGNOSIS — Z7689 Persons encountering health services in other specified circumstances: Secondary | ICD-10-CM

## 2024-05-28 DIAGNOSIS — R569 Unspecified convulsions: Secondary | ICD-10-CM

## 2024-05-28 NOTE — Progress Notes (Signed)
 "  New Patient Office Visit  Subjective    Patient ID: Marcus Little, male    DOB: 1980-08-13  Age: 44 y.o. MRN: [id number]  CC:  Chief Complaint  Patient presents with   Establish Care    F/u from hospital visit 1/10 for seizure. Was referred to [facility].  Concerned of losing his license.     Discussed the use of AI scribe software for clinical note transcription with the patient, who gave verbal consent to proceed.  History of Present Illness Marcus Little is a 44 year old male who presents to establish care following a recent emergency department visit for a seizure. He is accompanied by his significant other.  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  - No current headache, dizziness, chest pain, palpitations, shortness of breath, numbness, tingling, or weakness - Feels improved each day since the event - Has not yet scheduled a neurology appointment  Emergency department evaluation - CT head showed no acute abnormality - Laboratory studies were overall unremarkable except for mildly elevated WBC - Urine drug screen was positive for THC  Gastrointestinal symptoms - History of diverticulitis with prior flares, including one hospitalization - Known diverticulosis - Manages condition with dietary modifications to reduce risk of recurrence  Substance use - Does not drink alcohol - Uses THC drinks     Outpatient Encounter Medications as of 05/28/2024  Medication Sig   acetaminophen  (TYLENOL ) 650 MG CR tablet Take 1,300 mg by mouth every 8 (eight) hours as needed (knee pain.).   [DISCONTINUED] Hypromell-Glycerin-Naphazoline (CLEAR EYES FOR DRY EYES PLUS OP) Place 1-2 drops into both  eyes 3 (three) times daily as needed (dry/irritated eyes.).   No facility-administered encounter medications on file as of 05/28/2024.    Past Medical History:  Diagnosis Date   Arthritis    Depression    Diverticulitis 01/11/2018   Inpt    GERD (gastroesophageal reflux disease)    Pre-diabetes    Prediabetes    Seizures (HCC)    one time at age 74    Past Surgical History:  Procedure Laterality Date   COLONOSCOPY WITH PROPOFOL  N/A 03/24/2018   Procedure: COLONOSCOPY WITH PROPOFOL ;  Surgeon: Toledo, Ladell POUR, MD;  Location: [facility] ENDOSCOPY;  Service: Gastroenterology;  Laterality: N/A;   Hernia mesh repair surgery  2024    Family History  Problem Relation Age of Onset   Drug abuse Mother    Depression Mother    COPD Mother    Arthritis Mother    Cancer Mother        spleen   Kidney disease Father    Arthritis Father    Alcohol abuse Father    Cancer Maternal Grandmother        breast cancer    Social History   Socioeconomic History   Marital status: Single    Spouse name: emily   Number of children: 1   Years of education: Not on file   Highest education level: Not on file  Occupational History   Not on file  Tobacco Use   Smoking status: Former    Types: Cigars   Smokeless tobacco: Never  Vaping Use   Vaping status:  Some Days   Substances: Nicotine, Flavoring  Substance and Sexual Activity   Alcohol use: Not Currently   Drug use: Not Currently    Types: Marijuana    Comment: Sat last use 03/21/18   Sexual activity: Not on file  Other Topics Concern   Not on file  Social History Narrative   Not on file   Social Drivers of Health   Tobacco Use: Medium Risk (05/28/2024)   Patient History    Smoking Tobacco Use: Former    Smokeless Tobacco Use: Never    Passive Exposure: Not on Actuary Strain: Not on file  Food Insecurity: Not on file  Transportation Needs: Not on file  Physical Activity: Not on file  Stress: Not on file   Social Connections: Not on file  Intimate Partner Violence: Not on file  Depression (PHQ2-9): Low Risk (05/28/2024)   Depression (PHQ2-9)    PHQ-2 Score: 0  Alcohol Screen: Not on file  Housing: Not on file  Utilities: Not on file  Health Literacy: Not on file    Review of Systems  Constitutional:  Negative for chills, fever and malaise/fatigue.  HENT:  Negative for congestion, ear pain, sinus pain and sore throat.   Eyes:  Negative for blurred vision, double vision and pain.  Respiratory:  Negative for cough, shortness of breath and wheezing.   Cardiovascular:  Negative for chest pain, palpitations and leg swelling.  Gastrointestinal:  Negative for abdominal pain, constipation, diarrhea, nausea and vomiting.  Genitourinary:  Negative for dysuria, frequency and urgency.  Musculoskeletal:  Positive for joint pain. Negative for back pain and myalgias.  Neurological:  Negative for dizziness, tingling, focal weakness and headaches.  Psychiatric/Behavioral:  Negative for depression, memory loss and suicidal ideas. The patient is not nervous/anxious.         Objective    BP 120/68   Pulse 70   Temp 97.8 F (36.6 C) (Temporal)   Ht 5' 8 (1.727 m)   Wt 219 lb (99.3 kg)   SpO2 98%   BMI 33.30 kg/m   Physical Exam Constitutional:      General: He is not in acute distress.    Appearance: He is not ill-appearing.  HENT:     Head: Atraumatic.     Nose: Nose normal.     Mouth/Throat:     Mouth: Mucous membranes are moist.     Pharynx: Oropharynx is clear.  Eyes:     General: No visual field deficit.    Extraocular Movements: Extraocular movements intact.     Conjunctiva/sclera: Conjunctivae normal.     Pupils: Pupils are equal, round, and reactive to light.  Neck:     Thyroid: No thyromegaly.     Vascular: No carotid bruit or JVD.  Cardiovascular:     Rate and Rhythm: Normal rate and regular rhythm.  Pulmonary:     Effort: Pulmonary effort is normal.     Breath  sounds: Normal breath sounds.  Musculoskeletal:        General: Normal range of motion.     Cervical back: Normal range of motion and neck supple. No tenderness.     Right lower leg: No edema.     Left lower leg: No edema.  Lymphadenopathy:     Cervical: No cervical adenopathy.  Skin:    General: Skin is warm and dry.  Neurological:     General: No focal deficit present.     Mental Status: He is alert and  oriented to person, place, and time.     Cranial Nerves: No cranial nerve deficit or facial asymmetry.     Sensory: No sensory deficit.     Motor: No weakness or tremor.     Coordination: Coordination normal.     Gait: Gait normal.  Psychiatric:        Attention and Perception: Attention normal.        Mood and Affect: Mood normal.        Speech: Speech normal.        Behavior: Behavior normal.        Thought Content: Thought content normal.         Assessment & Plan:   Problem List Items Addressed This Visit   None Visit Diagnoses       Seizure (HCC)    -  Primary     Encounter to establish care         History of diverticulitis of colon           Assessment and Plan Assessment & Plan Recent seizure Recent seizure episode witnessed by significant other, transported to ED for evaluation. Head CT scan showed no acute abnormalities. Labs showed slightly elevated WBC, likely due to seizure activity. UDS positive for marijuana. No current neurology appointment. Discussed potential seizure triggers, including melatonin and THC combination. Advised against driving for six months post-seizure. Neurology follow-up recommended for further evaluation and potential EEG. - Reportedly back to baseline - Referred to neurology for further evaluation  - Advised against driving for six months post-seizure. - Discussed potential seizure triggers, including melatonin and THC combination.  Diverticulosis History of diverticulosis with past episodes of diverticulitis, one requiring  hospitalization. No current acute infection. - Continue to monitor for symptoms of diverticulitis.  General Health Maintenance Up to date with preventive health care and immunizations.  - Continue routine preventive health care and immunizations.     Return in about 6 months (around 11/25/2024).   Boby Mackintosh, NP-C   "

## 2024-05-28 NOTE — Patient Instructions (Signed)
 Please call and schedule with neurology as we discussed.
